# Patient Record
Sex: Female | Born: 1955 | Hispanic: Yes | Marital: Married | Smoking: Never smoker
Health system: Southern US, Community
[De-identification: ages and names within clinical notes are randomized; demographics above are authoritative.]

## PROBLEM LIST (undated history)

## (undated) DIAGNOSIS — K219 Gastro-esophageal reflux disease without esophagitis: Secondary | ICD-10-CM

## (undated) DIAGNOSIS — R51 Headache: Secondary | ICD-10-CM

## (undated) DIAGNOSIS — A159 Respiratory tuberculosis unspecified: Secondary | ICD-10-CM

## (undated) DIAGNOSIS — R519 Headache, unspecified: Secondary | ICD-10-CM

## (undated) DIAGNOSIS — J189 Pneumonia, unspecified organism: Secondary | ICD-10-CM

## (undated) DIAGNOSIS — N189 Chronic kidney disease, unspecified: Secondary | ICD-10-CM

## (undated) DIAGNOSIS — M8580 Other specified disorders of bone density and structure, unspecified site: Secondary | ICD-10-CM

## (undated) DIAGNOSIS — J449 Chronic obstructive pulmonary disease, unspecified: Secondary | ICD-10-CM

## (undated) DIAGNOSIS — K5909 Other constipation: Secondary | ICD-10-CM

## (undated) HISTORY — DX: Other specified disorders of bone density and structure, unspecified site: M85.80

## (undated) HISTORY — PX: TUBAL LIGATION: SHX77

## (undated) HISTORY — PX: ABDOMINAL SURGERY: SHX537

## (undated) HISTORY — DX: Gastro-esophageal reflux disease without esophagitis: K21.9

## (undated) HISTORY — DX: Other constipation: K59.09

---

## 2011-08-24 ENCOUNTER — Ambulatory Visit: Payer: Self-pay

## 2012-01-04 ENCOUNTER — Ambulatory Visit: Payer: Self-pay

## 2012-05-04 LAB — HM DEXA SCAN

## 2012-06-23 ENCOUNTER — Ambulatory Visit: Payer: Self-pay

## 2012-08-28 ENCOUNTER — Ambulatory Visit: Payer: Self-pay | Admitting: Family Medicine

## 2013-05-03 ENCOUNTER — Ambulatory Visit: Payer: Self-pay | Admitting: Family Medicine

## 2013-06-04 ENCOUNTER — Emergency Department: Payer: Self-pay | Admitting: Emergency Medicine

## 2013-06-04 LAB — URINALYSIS, COMPLETE
Bacteria: NONE SEEN
Bilirubin,UR: NEGATIVE
Blood: NEGATIVE
Glucose,UR: NEGATIVE mg/dL (ref 0–75)
Ketone: NEGATIVE
Nitrite: NEGATIVE
RBC,UR: NONE SEEN /HPF (ref 0–5)

## 2013-06-04 LAB — COMPREHENSIVE METABOLIC PANEL
Albumin: 4.3 g/dL (ref 3.4–5.0)
Alkaline Phosphatase: 74 U/L
Anion Gap: 9 (ref 7–16)
Bilirubin,Total: 0.4 mg/dL (ref 0.2–1.0)
Chloride: 102 mmol/L (ref 98–107)
Co2: 27 mmol/L (ref 21–32)
Creatinine: 0.85 mg/dL (ref 0.60–1.30)
EGFR (African American): >60
EGFR (Non-African Amer.): >60
Glucose: 101 mg/dL — ABNORMAL HIGH (ref 65–99)
Potassium: 3.9 mmol/L (ref 3.5–5.1)
SGOT(AST): 30 U/L (ref 15–37)
SGPT (ALT): 33 U/L (ref 12–78)

## 2013-06-04 LAB — CBC WITH DIFFERENTIAL/PLATELET
Basophil #: 0.1 10*3/uL (ref 0.0–0.1)
Basophil %: 1.2 %
HGB: 14.3 g/dL (ref 12.0–16.0)
Lymphocyte #: 2.4 10*3/uL (ref 1.0–3.6)
MCHC: 34.8 g/dL (ref 32.0–36.0)
Monocyte %: 8.6 %
Platelet: 202 10*3/uL (ref 150–440)
RBC: 4.64 10*6/uL (ref 3.80–5.20)
RDW: 12.3 % (ref 11.5–14.5)

## 2013-06-04 LAB — LIPASE, BLOOD: Lipase: 219 U/L (ref 73–393)

## 2013-06-14 ENCOUNTER — Ambulatory Visit: Payer: Self-pay | Admitting: Family Medicine

## 2014-08-27 ENCOUNTER — Ambulatory Visit: Payer: Self-pay

## 2014-09-03 LAB — HM PAP SMEAR: HM Pap smear: NEGATIVE

## 2015-01-22 DIAGNOSIS — I10 Essential (primary) hypertension: Secondary | ICD-10-CM

## 2015-01-22 DIAGNOSIS — K219 Gastro-esophageal reflux disease without esophagitis: Secondary | ICD-10-CM | POA: Insufficient documentation

## 2015-01-22 DIAGNOSIS — K5909 Other constipation: Secondary | ICD-10-CM

## 2015-01-22 DIAGNOSIS — M8580 Other specified disorders of bone density and structure, unspecified site: Secondary | ICD-10-CM

## 2015-01-24 ENCOUNTER — Ambulatory Visit: Payer: Self-pay | Admitting: Unknown Physician Specialty

## 2015-05-14 ENCOUNTER — Encounter: Payer: Self-pay | Admitting: Family Medicine

## 2015-05-14 ENCOUNTER — Ambulatory Visit (INDEPENDENT_AMBULATORY_CARE_PROVIDER_SITE_OTHER): Payer: 59 | Admitting: Family Medicine

## 2015-05-14 ENCOUNTER — Other Ambulatory Visit: Payer: Self-pay | Admitting: Family Medicine

## 2015-05-14 VITALS — BP 128/84 | HR 67 | Temp 97.9°F | Ht 61.1 in | Wt 116.0 lb

## 2015-05-14 DIAGNOSIS — M899 Disorder of bone, unspecified: Secondary | ICD-10-CM

## 2015-05-14 DIAGNOSIS — M8580 Other specified disorders of bone density and structure, unspecified site: Secondary | ICD-10-CM

## 2015-05-14 DIAGNOSIS — Z1239 Encounter for other screening for malignant neoplasm of breast: Secondary | ICD-10-CM

## 2015-05-14 DIAGNOSIS — N390 Urinary tract infection, site not specified: Secondary | ICD-10-CM | POA: Diagnosis not present

## 2015-05-14 DIAGNOSIS — R300 Dysuria: Secondary | ICD-10-CM | POA: Diagnosis not present

## 2015-05-14 DIAGNOSIS — M898X9 Other specified disorders of bone, unspecified site: Secondary | ICD-10-CM

## 2015-05-14 LAB — MICROSCOPIC EXAMINATION

## 2015-05-14 MED ORDER — CIPROFLOXACIN HCL 500 MG PO TABS: 500.0000 mg | ORAL_TABLET | Freq: Two times a day (BID) | ORAL | Status: DC

## 2015-05-14 NOTE — Progress Notes (Signed)
BP 128/84 mmHg  Pulse 67  Temp(Src) 97.9 F (36.6 C)  Ht 5' 1.1" (1.552 m)  Wt 116 lb (52.617 kg)  BMI 21.84 kg/m2  SpO2 100%  LMP  (LMP Unknown)   Subjective:    Patient ID: Wendy Ashley, female    DOB: [birth year], 59 y.o.   MRN: [id number]  HPI: Wendy Ashley is a 59 y.o. female  Chief Complaint  Patient presents with  . Urinary Tract Infection    burning with urination, swelling in abdomen, fatigue and aches.   URINARY SYMPTOMS Duration: yesterday Dysuria: burning Urinary frequency: yes Urgency: yes Small volume voids: yes Symptom severity: moderate Urinary incontinence: no Foul odor: yes Hematuria: yes Abdominal pain: yes Back pain: yes Suprapubic pain/pressure: yes Flank pain: yes Fever:  subjective Vomiting: no Relief with cranberry juice: no Relief with pyridium: no Status: worse Previous urinary tract infection: yes Recurrent urinary tract infection: yes Vaginal discharge: no Treatments attempted: pyridium, cranberry and increasing fluids   Relevant past medical, surgical, family and social history reviewed and updated as indicated. Interim medical history since our last visit reviewed. Allergies and medications reviewed and updated.  Review of Systems  Constitutional: Negative.   Respiratory: Negative.   Cardiovascular: Negative.   Genitourinary: Negative.   Musculoskeletal: Positive for myalgias, back pain and arthralgias. Negative for joint swelling, gait problem, neck pain and neck stiffness.  Psychiatric/Behavioral: Negative.     Per HPI unless specifically indicated above     Objective:    BP 128/84 mmHg  Pulse 67  Temp(Src) 97.9 F (36.6 C)  Ht 5' 1.1" (1.552 m)  Wt 116 lb (52.617 kg)  BMI 21.84 kg/m2  SpO2 100%  LMP  (LMP Unknown)  Wt Readings from Last 3 Encounters:  05/14/15 116 lb (52.617 kg)  09/03/14 115 lb (52.164 kg)    Physical Exam  Constitutional: She is oriented to person, place, and time. She appears  well-developed and well-nourished. No distress.  HENT:  Head: Normocephalic and atraumatic.  Right Ear: Hearing normal.  Left Ear: Hearing normal.  Nose: Nose normal.  Eyes: Conjunctivae and lids are normal. Right eye exhibits no discharge. Left eye exhibits no discharge. No scleral icterus.  Cardiovascular: Normal rate, regular rhythm, normal heart sounds and intact distal pulses.  Exam reveals no gallop and no friction rub.   No murmur heard. Pulmonary/Chest: Effort normal and breath sounds normal. No respiratory distress. She has no wheezes. She has no rales. She exhibits no tenderness.  Abdominal: Soft. Bowel sounds are normal. She exhibits no distension and no mass. There is no hepatosplenomegaly. There is tenderness in the suprapubic area. There is CVA tenderness. There is no rebound and no guarding.  Musculoskeletal: Normal range of motion.  Neurological: She is alert and oriented to person, place, and time.  Skin: Skin is warm, dry and intact. No rash noted. No erythema. No pallor.  Psychiatric: She has a normal mood and affect. Her speech is normal and behavior is normal. Judgment and thought content normal. Cognition and memory are normal.  Nursing note and vitals reviewed.   Results for orders placed or performed in visit on 06/04/13  Urinalysis, Complete  Result Value Ref Range   Color - urine Straw    Clarity - urine Clear    Glucose,UR Negative 0-75 mg/dL   Bilirubin,UR Negative NEGATIVE   Ketone Negative NEGATIVE   Specific Gravity 1.006 1.003-1.030   Blood Negative NEGATIVE   Ph 7.0 4.5-8.0   Protein Negative  NEGATIVE   Nitrite Negative NEGATIVE   Leukocyte Esterase Negative NEGATIVE   RBC,UR NONE SEEN 0-5 /HPF   WBC UR 1 /HPF 0-5 /HPF   Bacteria NONE SEEN NONE SEEN   Squamous Epithelial 1 /HPF   CBC with Differential/Platelet  Result Value Ref Range   WBC 6.2 3.6-11.0 x10 3/mm 3   RBC 4.64 3.80-5.20 X10 6/mm 3   HGB 14.3 12.0-16.0 g/dL   HCT 41.2 35.0-47.0 %    MCV 89 80-100 fL   MCH 30.9 26.0-34.0 pg   MCHC 34.8 32.0-36.0 g/dL   RDW 12.3 11.5-14.5 %   Platelet 202 150-440 x10 3/mm 3   Neutrophil % 48.6 %   Lymphocyte % 39.1 %   Monocyte % 8.6 %   Eosinophil % 2.5 %   Basophil % 1.2 %   Neutrophil # 3.0 1.4-6.5 x10 3/mm 3   Lymphocyte # 2.4 1.0-3.6 x10 3/mm 3   Monocyte # 0.5 0.2-0.9 x10 3/mm    Eosinophil # 0.2 0.0-0.7 x10 3/mm 3   Basophil # 0.1 0.0-0.1 x10 3/mm 3  Comprehensive metabolic panel  Result Value Ref Range   Glucose 101 (H) 65-99 mg/dL   BUN 18 7-18 mg/dL   Creatinine 0.85 0.60-1.30 mg/dL   Sodium 138 136-145 mmol/L   Potassium 3.9 3.5-5.1 mmol/L   Chloride 102 98-107 mmol/L   Co2 27 21-32 mmol/L   Calcium, Total 9.4 8.5-10.1 mg/dL   SGOT(AST) 30 15-37 Unit/L   SGPT (ALT) 33 12-78 U/L   Alkaline Phosphatase 74 Unit/L   Albumin 4.3 3.4-5.0 g/dL   Total Protein 7.8 6.4-8.2 g/dL   Bilirubin,Total 0.4 0.2-1.0 mg/dL   Osmolality 278 275-301   Anion Gap 9 7-16   EGFR (African American) >60    EGFR (Non-African Amer.) >60   Lipase, blood  Result Value Ref Range   Lipase 219 73-393 Unit/L      Assessment & Plan:   Problem List Items Addressed This Visit      Musculoskeletal and Integument   Osteopenia    Due for DEXA after 05/18/15- ordered today.      Relevant Orders   DG Bone Density     Genitourinary   Recurrent UTI - Primary    Has another UTI today. Will treat with cipro x 7 days due to CVA tenderness. Would like to see a specialist because she says she gets them all the time. Referral generated today.      Relevant Orders   Ambulatory referral to Urology    Other Visit Diagnoses    Dysuria        Checking UA.     Screening for breast cancer        Never got her mammogram. Ordered today. She will get it with her bone density    Relevant Orders    MM DIGITAL SCREENING BILATERAL    Bone pain        Possibly due to illness. Wants repeat DEXA- ordered today. Follow up with PCP in 2-3 weeks if not  resolved.         Follow up plan: Return 2-3 weeks with Malachy Mood, for follow up bone pain.

## 2015-05-14 NOTE — Assessment & Plan Note (Signed)
Has another UTI today. Will treat with cipro x 7 days due to CVA tenderness. Would like to see a specialist because she says she gets them all the time. Referral generated today.

## 2015-05-14 NOTE — Assessment & Plan Note (Signed)
Due for DEXA after 05/18/15- ordered today.

## 2015-05-19 LAB — UA/M W/RFLX CULTURE, ROUTINE

## 2015-05-19 LAB — URINE CULTURE, REFLEX

## 2015-05-22 ENCOUNTER — Encounter: Payer: Self-pay | Admitting: Obstetrics and Gynecology

## 2015-05-22 ENCOUNTER — Ambulatory Visit (INDEPENDENT_AMBULATORY_CARE_PROVIDER_SITE_OTHER): Payer: 59 | Admitting: Obstetrics and Gynecology

## 2015-05-22 VITALS — BP 126/84 | HR 74 | Resp 16 | Ht 62.0 in | Wt 117.7 lb

## 2015-05-22 DIAGNOSIS — N390 Urinary tract infection, site not specified: Secondary | ICD-10-CM | POA: Diagnosis not present

## 2015-05-22 LAB — BLADDER SCAN AMB NON-IMAGING: Scan Result: 23

## 2015-05-22 NOTE — Patient Instructions (Signed)

## 2015-05-22 NOTE — Progress Notes (Signed)
05/22/2015 1:05 PM   Wendy Ashley 15-Nov-1955 [id number]  Referring provider: Kathrine Haddock, NP [place], [place] [address]  Chief Complaint  Patient presents with  . Recurrent UTI  . Establish Care    HPI: Patient is a 59 year old female presenting today as a referral from her primary care provider complaints of recurrent urinary tract infections for the last year. She reports recurrent symptoms of dysuria, frequency, few episodes of gross hematuria, nocturia up to 2 times per night and suprapubic discomfort.  Patient reports UTI occuring once per month for the last year.  She states that she is unsure if previous specimens were sent for culture.  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  Urinary symptoms include urinary frequency, nocturia 2 times per night, intermittent dysuria.  Patient reports that she did notice  Patient states that she finished Cipro yesterday.  H2O intake 4-5 bottle of water daily. No history of renal stones.  No vaginal complaints.   PMH: Past Medical History  Diagnosis Date  . Chronic constipation   . Hypertension   . GERD (gastroesophageal reflux disease)   . Osteopenia     Surgical History: Past Surgical History  Procedure Laterality Date  . Tubal ligation      Home Medications:    Medication List       This list is accurate as of: 05/22/15 11:59 PM.  Always use your most recent med list.               ciprofloxacin 500 MG tablet  Commonly known as:  CIPRO  Take 1 tablet (500 mg total) by mouth 2 (two) times daily.     omeprazole 20 MG capsule  Commonly known as:  PRILOSEC  Take 20 mg by mouth daily.        Allergies:  Allergies  Allergen Reactions  . Tylenol [Acetaminophen]     Family History: Family History  Problem Relation Age of Onset  . Hypertension Mother   . Alzheimer's disease Father     Social History:  reports that she has  never smoked. She does not have any smokeless tobacco history on file. She reports that she does not drink alcohol or use illicit drugs.  ROS: UROLOGY Frequent Urination?: Yes Hard to postpone urination?: No Burning/pain with urination?: Yes Get up at night to urinate?: Yes Leakage of urine?: No Urine stream starts and stops?: No Trouble starting stream?: No Do you have to strain to urinate?: No Blood in urine?: Yes Urinary tract infection?: No Sexually transmitted disease?: No Injury to kidneys or bladder?: No Painful intercourse?: Yes Weak stream?: No Currently pregnant?: No Vaginal bleeding?: No Last menstrual period?: 1996  Gastrointestinal Nausea?: Yes Vomiting?: No Indigestion/heartburn?: Yes Diarrhea?: No Constipation?: Yes  Constitutional Fever: Yes Night sweats?: No Weight loss?: No Fatigue?: Yes  Skin Skin rash/lesions?: No Itching?: No  Eyes Blurred vision?: No Double vision?: No  Ears/Nose/Throat Sore throat?: No Sinus problems?: No  Hematologic/Lymphatic Swollen glands?: No Easy bruising?: No  Cardiovascular Leg swelling?: No Chest pain?: No  Respiratory Cough?: No Shortness of breath?: No  Endocrine Excessive thirst?: No  Musculoskeletal Back pain?: Yes Joint pain?: No  Neurological Headaches?: No Dizziness?: Yes  Psychologic Depression?: No Anxiety?: No  Physical Exam: BP 126/84 mmHg  Pulse 74  Resp 16  Ht 5\' 2"  (1.575 m)  Wt 117 lb 11.2 oz (53.388 kg)  BMI 21.52 kg/m2  LMP  (LMP Unknown)  Constitutional:  Alert and oriented, No acute distress. HEENT: [place] AT, moist mucus membranes.  Trachea midline, no masses. Cardiovascular: No clubbing, cyanosis, or edema. Respiratory: Normal respiratory effort, no increased work of breathing. GI: Abdomen is soft, mild suprapubic tenderness, nondistended, no abdominal masses GU: No CVA tenderness. Pelvic: mild vaginal atrophy, normal cervix and uterus, no lesions, good vaginal  vault support Skin: No rashes, bruises or suspicious lesions. Lymph: No cervical or inguinal adenopathy. Neurologic: Grossly intact, no focal deficits, moving all 4 extremities. Psychiatric: Normal mood and affect.  Laboratory Data:   Urinalysis Results for orders placed or performed in visit on 05/22/15  Microscopic Examination  Result Value Ref Range   WBC, UA None seen 0 -  5 /hpf   RBC, UA None seen 0 -  2 /hpf   Epithelial Cells (non renal) None seen 0 - 10 /hpf   Bacteria, UA None seen None seen/Few  Urinalysis, Complete  Result Value Ref Range   Specific Gravity, UA <1.005 (L) 1.005 - 1.030   pH, UA 5.5 5.0 - 7.5   Color, UA Yellow Yellow   Appearance Ur Clear Clear   Leukocytes, UA Negative Negative   Protein, UA Negative Negative/Trace   Glucose, UA Negative Negative   Ketones, UA Negative Negative   RBC, UA Trace (A) Negative   Bilirubin, UA Negative Negative   Urobilinogen, Ur 0.2 0.2 - 1.0 mg/dL   Nitrite, UA Negative Negative   Microscopic Examination See below:   BLADDER SCAN AMB NON-IMAGING  Result Value Ref Range   Scan Result 23 mL     Pertinent Imaging:   Assessment & Plan:    1. Gross hematuria- Intermittent episodes of gross hematuria associated with irritative voiding symptoms. We discussed the differential diagnosis for hematuria including nephrolithiasis, renal or upper tract tumors, bladder stones, UTIs, or bladder tumors as well as undetermined etiologies. Per AUA guidelines, I did recommend complete hematuria evaluation including CTU, possible urine cytology, and office cystoscopy.  2. Recurrent UTI- UTI prevention strategies discussed.  Good perineal hygiene reviewed. Patient is encouraged to increase daily water intake, start cranberry supplements to prevent invasive colonization along the urinary tract and probiotics, especially lactobacillus to restore normal vaginal flora. - Urinalysis, Complete - BLADDER SCAN AMB NON-IMAGING  Return for  CT results; cystoscopy.  These notes generated with voice recognition software. I apologize for typographical errors.  Herbert Moors, [facility] [address], [place], [place] [address] [phone]

## 2015-05-23 LAB — URINALYSIS, COMPLETE
Bilirubin, UA: NEGATIVE
GLUCOSE, UA: NEGATIVE
KETONES UA: NEGATIVE
Leukocytes, UA: NEGATIVE
NITRITE UA: NEGATIVE
Protein, UA: NEGATIVE
Urobilinogen, Ur: 0.2 mg/dL (ref 0.2–1.0)
pH, UA: 5.5 (ref 5.0–7.5)

## 2015-05-23 LAB — MICROSCOPIC EXAMINATION
Bacteria, UA: NONE SEEN
Epithelial Cells (non renal): NONE SEEN /hpf (ref 0–10)
RBC MICROSCOPIC, UA: NONE SEEN /HPF (ref 0–?)
WBC, UA: NONE SEEN /hpf (ref 0–?)

## 2015-05-27 ENCOUNTER — Ambulatory Visit: Payer: 59 | Admitting: Family Medicine

## 2015-05-30 ENCOUNTER — Ambulatory Visit (INDEPENDENT_AMBULATORY_CARE_PROVIDER_SITE_OTHER): Payer: 59 | Admitting: Family Medicine

## 2015-05-30 ENCOUNTER — Encounter: Payer: Self-pay | Admitting: Family Medicine

## 2015-05-30 VITALS — BP 121/80 | HR 72 | Temp 98.6°F | Wt 116.0 lb

## 2015-05-30 DIAGNOSIS — K121 Other forms of stomatitis: Secondary | ICD-10-CM

## 2015-05-30 DIAGNOSIS — K1230 Oral mucositis (ulcerative), unspecified: Secondary | ICD-10-CM | POA: Diagnosis not present

## 2015-05-30 MED ORDER — MAGIC MOUTHWASH W/LIDOCAINE: 5.0000 mL | Freq: Three times a day (TID) | ORAL | Status: DC | PRN

## 2015-05-30 MED ORDER — VALACYCLOVIR HCL 1 G PO TABS: 2000.0000 mg | ORAL_TABLET | Freq: Two times a day (BID) | ORAL | Status: DC

## 2015-05-30 NOTE — Progress Notes (Signed)
BP 121/80 mmHg  Pulse 72  Temp(Src) 98.6 F (37 C)  Wt 116 lb (52.617 kg)  SpO2 98%  LMP  (LMP Unknown)   Subjective:    Patient ID: Wendy Ashley, female    DOB: July 30, 1955, 59 y.o.   MRN: [id number]  HPI: Wendy Ashley is a 59 y.o. female  Chief Complaint  Patient presents with  . Oral Swelling    Patient states that she has had swelling, pain,burning in her lips for the last two weeks.   Swollen Lips- when she wakes up her lips are swollen. White spots and burning, ate shrimp 2 weeks ago and thought that it was the cause, but it has lasted too long Duration: 2 weeks (17 days) Location: lips a little bit into the face  Itching: yes Burning: yes Redness: yes  Swelling: yes Oozing: no Scaling: no Blisters: no Painful: yes Fevers: subjective Change in detergents/soaps/personal care products: no Recent illness: no Recent travel:no History of same: no Context: stable Alleviating factors: lotion/moisturizer Treatments attempted:lotion/moisturizer Shortness of breath: no  Throat/tongue swelling: no Myalgias/arthralgias: no  Relevant past medical, surgical, family and social history reviewed and updated as indicated. Interim medical history since our last visit reviewed. Allergies and medications reviewed and updated.  Review of Systems  Constitutional: Negative.   HENT: Negative.   Respiratory: Negative.   Cardiovascular: Negative.   Psychiatric/Behavioral: Negative.    Per HPI unless specifically indicated above     Objective:    BP 121/80 mmHg  Pulse 72  Temp(Src) 98.6 F (37 C)  Wt 116 lb (52.617 kg)  SpO2 98%  LMP  (LMP Unknown)  Wt Readings from Last 3 Encounters:  05/30/15 116 lb (52.617 kg)  05/22/15 117 lb 11.2 oz (53.388 kg)  05/14/15 116 lb (52.617 kg)    Physical Exam  Constitutional: She is oriented to person, place, and time. She appears well-developed and well-nourished. No distress.  HENT:  Head: Normocephalic and atraumatic.  Right  Ear: Hearing normal.  Left Ear: Hearing normal.  Nose: Nose normal.  Mouth/Throat: Uvula is midline and mucous membranes are normal. Posterior oropharyngeal edema and posterior oropharyngeal erythema present. No oropharyngeal exudate or tonsillar abscesses.    vesicles along lateral borders of lips, lips swollen, tongue normal, slight erythema posterior-pharynx, no vesicles  Eyes: Conjunctivae and lids are normal. Right eye exhibits no discharge. Left eye exhibits no discharge. No scleral icterus.  Pulmonary/Chest: Effort normal. No respiratory distress.  Musculoskeletal: Normal range of motion.  Neurological: She is alert and oriented to person, place, and time.  Skin: Skin is intact. No rash noted.  Psychiatric: She has a normal mood and affect. Her speech is normal and behavior is normal. Judgment and thought content normal. Cognition and memory are normal.  Nursing note and vitals reviewed.   Results for orders placed or performed in visit on 05/22/15  Microscopic Examination  Result Value Ref Range   WBC, UA None seen 0 -  5 /hpf   RBC, UA None seen 0 -  2 /hpf   Epithelial Cells (non renal) None seen 0 - 10 /hpf   Bacteria, UA None seen None seen/Few  Urinalysis, Complete  Result Value Ref Range   Specific Gravity, UA <1.005 (L) 1.005 - 1.030   pH, UA 5.5 5.0 - 7.5   Color, UA Yellow Yellow   Appearance Ur Clear Clear   Leukocytes, UA Negative Negative   Protein, UA Negative Negative/Trace   Glucose, UA Negative Negative  Ketones, UA Negative Negative   RBC, UA Trace (A) Negative   Bilirubin, UA Negative Negative   Urobilinogen, Ur 0.2 0.2 - 1.0 mg/dL   Nitrite, UA Negative Negative   Microscopic Examination See below:   BLADDER SCAN AMB NON-IMAGING  Result Value Ref Range   Scan Result 23 mL       Assessment & Plan:   Problem List Items Addressed This Visit    None    Visit Diagnoses    Stomatitis and mucositis    -  Primary    Appears to be stomatitis. Will  treat with valtrex and magic mouth wash and check in on how she's doing on Monday. If not better, will do course of prednisone.         Follow up plan: Return if symptoms worsen or fail to improve.

## 2015-06-02 ENCOUNTER — Telehealth: Payer: Self-pay | Admitting: Family Medicine

## 2015-06-02 MED ORDER — PREDNISONE 10 MG PO TABS: ORAL_TABLET | ORAL | Status: DC

## 2015-06-02 NOTE — Telephone Encounter (Signed)
Rx for prednisone sent to her pharmacy. If not feeling better in the next day or so, let us know

## 2015-06-02 NOTE — Telephone Encounter (Signed)
Forward to provider

## 2015-06-02 NOTE — Telephone Encounter (Signed)
Pt called stated the swelling in her lips is not going down, wants to know if something different can be called in she does not feel like the current medication is working. Pharm is [facility] [address] Thanks.

## 2015-06-02 NOTE — Telephone Encounter (Signed)
Patient notified

## 2015-06-03 ENCOUNTER — Ambulatory Visit: Payer: 59 | Admitting: Unknown Physician Specialty

## 2015-06-10 ENCOUNTER — Ambulatory Visit: Payer: 59 | Admitting: Obstetrics and Gynecology

## 2015-06-11 ENCOUNTER — Other Ambulatory Visit: Payer: Self-pay | Admitting: Obstetrics and Gynecology

## 2015-06-11 ENCOUNTER — Telehealth: Payer: Self-pay | Admitting: Obstetrics and Gynecology

## 2015-06-11 DIAGNOSIS — R310 Gross hematuria: Secondary | ICD-10-CM

## 2015-06-11 NOTE — Telephone Encounter (Signed)
Patient called today asking why she never got a call to schd her CT scan? I looked back at your notes and it looks like she was to be schd for a CT and a cysto but the orders were never put in. So does she still need to have them done? She cancelled her last appt with [place] via the automated reminder.   Thanks,  Sharyn Lull

## 2015-06-11 NOTE — Telephone Encounter (Signed)
Yes she does need a CT Urogram.  I'm not sure why the order wasn't in.  I just put one in.  Thank you for checking on that. She needs an appt to follow up for results and had a cysto.  Thanks for checking on this.

## 2015-06-17 ENCOUNTER — Ambulatory Visit
Admission: RE | Admit: 2015-06-17 | Discharge: 2015-06-17 | Disposition: A | Discharge status: Home / Self Care | Payer: 59 | Source: Ambulatory Visit | Attending: Family Medicine | Admitting: Family Medicine

## 2015-06-17 DIAGNOSIS — Z1231 Encounter for screening mammogram for malignant neoplasm of breast: Secondary | ICD-10-CM | POA: Diagnosis present

## 2015-06-17 DIAGNOSIS — Z13820 Encounter for screening for osteoporosis: Secondary | ICD-10-CM | POA: Insufficient documentation

## 2015-06-17 DIAGNOSIS — Z1239 Encounter for other screening for malignant neoplasm of breast: Secondary | ICD-10-CM

## 2015-06-17 DIAGNOSIS — M8580 Other specified disorders of bone density and structure, unspecified site: Secondary | ICD-10-CM | POA: Diagnosis not present

## 2015-06-18 ENCOUNTER — Encounter: Payer: Self-pay | Admitting: Family Medicine

## 2015-06-24 ENCOUNTER — Ambulatory Visit: Payer: Self-pay

## 2015-06-24 ENCOUNTER — Encounter: Payer: Self-pay | Admitting: Urology

## 2015-06-24 ENCOUNTER — Other Ambulatory Visit: Payer: 59 | Admitting: Urology

## 2015-06-30 ENCOUNTER — Ambulatory Visit
Admission: RE | Admit: 2015-06-30 | Discharge: 2015-06-30 | Disposition: A | Discharge status: Home / Self Care | Payer: 59 | Source: Ambulatory Visit | Attending: Obstetrics and Gynecology | Admitting: Obstetrics and Gynecology

## 2015-06-30 DIAGNOSIS — R935 Abnormal findings on diagnostic imaging of other abdominal regions, including retroperitoneum: Secondary | ICD-10-CM | POA: Insufficient documentation

## 2015-06-30 DIAGNOSIS — R911 Solitary pulmonary nodule: Secondary | ICD-10-CM | POA: Diagnosis not present

## 2015-06-30 DIAGNOSIS — R310 Gross hematuria: Secondary | ICD-10-CM | POA: Insufficient documentation

## 2015-06-30 MED ORDER — IOHEXOL 350 MG/ML SOLN
125.0000 mL | Freq: Once | INTRAVENOUS | Status: AC | PRN
  Administered 2015-06-30: 125 mL via INTRAVENOUS

## 2015-07-17 ENCOUNTER — Telehealth: Payer: Self-pay | Admitting: Obstetrics and Gynecology

## 2015-07-17 NOTE — Telephone Encounter (Signed)
This patient artery had her CT urogram. She should've been scheduled for cystoscopy and CT results. I did not see any appointment scheduled. We please check on this. Thanks

## 2015-07-22 NOTE — Telephone Encounter (Signed)
Left another message for the patient to call the office to schedule CT results and cysto appointment.

## 2015-07-24 NOTE — Telephone Encounter (Signed)
Patient called back and i scheduled her appointments  Wendy Ashley

## 2015-07-31 ENCOUNTER — Encounter: Payer: Self-pay | Admitting: Urology

## 2015-07-31 ENCOUNTER — Ambulatory Visit (INDEPENDENT_AMBULATORY_CARE_PROVIDER_SITE_OTHER): Payer: 59 | Admitting: Urology

## 2015-07-31 VITALS — BP 130/91 | HR 71 | Ht 62.0 in | Wt 116.6 lb

## 2015-07-31 DIAGNOSIS — R310 Gross hematuria: Secondary | ICD-10-CM

## 2015-07-31 DIAGNOSIS — N390 Urinary tract infection, site not specified: Secondary | ICD-10-CM

## 2015-07-31 LAB — URINALYSIS, COMPLETE
Bilirubin, UA: NEGATIVE
Glucose, UA: NEGATIVE
KETONES UA: NEGATIVE
NITRITE UA: NEGATIVE
PH UA: 7.5 (ref 5.0–7.5)
Protein, UA: NEGATIVE
Specific Gravity, UA: 1.01 (ref 1.005–1.030)
Urobilinogen, Ur: 0.2 mg/dL (ref 0.2–1.0)

## 2015-07-31 LAB — MICROSCOPIC EXAMINATION: RBC MICROSCOPIC, UA: NONE SEEN /HPF (ref 0–?)

## 2015-07-31 MED ORDER — CIPROFLOXACIN HCL 500 MG PO TABS
500.0000 mg | ORAL_TABLET | Freq: Once | ORAL | Status: AC
  Administered 2015-07-31: 500 mg via ORAL

## 2015-07-31 MED ORDER — LIDOCAINE HCL 2 % EX GEL
1.0000 "application " | Freq: Once | CUTANEOUS | Status: AC
  Administered 2015-07-31: 1 via URETHRAL

## 2015-07-31 NOTE — Progress Notes (Signed)
07/31/2015 12:19 PM   Wendy Ashley 03/20/1956 [id number]  Referring provider: Kathrine Haddock, NP [place], [place] [address]  Chief Complaint  Patient presents with  . Cysto    gross hematuria, recurrent UTI    HPI: Patient is a 60 year old female presenting today as a referral from her primary care provider complaints of recurrent urinary tract infections for the last year. She reports recurrent symptoms of dysuria, frequency, few episodes of gross hematuria, nocturia up to 2 times per night and suprapubic discomfort.  Patient reports UTI occuring once per month for the last year. She states that she is unsure if previous specimens were sent for culture. 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  Urinary symptoms include urinary frequency, nocturia 2 times per night, intermittent dysuria. Patient reports that she did notice  Patient states that she finished Cipro yesterday.  H2O intake 4-5 bottle of water daily. No history of renal stones.  No vaginal complaints.  Interval history: The patient follows up for completion of her hematuria workup. Her CT was negative for any GU pathology. She did have lung nodules noted though.  PMH: Past Medical History  Diagnosis Date  . Chronic constipation   . Hypertension   . GERD (gastroesophageal reflux disease)   . Osteopenia     Surgical History: Past Surgical History  Procedure Laterality Date  . Tubal ligation      Home Medications:    Medication List       This list is accurate as of: 07/31/15 12:19 PM.  Always use your most recent med list.               omeprazole 20 MG capsule  Commonly known as:  PRILOSEC  Take 20 mg by mouth daily.     predniSONE 10 MG tablet  Commonly known as:  DELTASONE  6 tabs today, 5 tabs tomorrow, decrease by 1 each day until gone     valACYclovir 1000 MG tablet  Commonly known as:  VALTREX  Take  2 tablets (2,000 mg total) by mouth 2 (two) times daily. For 1 day, then 1/2 tab daily after that        Allergies:  Allergies  Allergen Reactions  . Tylenol [Acetaminophen]     Family History: Family History  Problem Relation Age of Onset  . Hypertension Mother   . Alzheimer's disease Father     Social History:  reports that she has never smoked. She does not have any smokeless tobacco history on file. She reports that she does not drink alcohol or use illicit drugs.  ROS:                                        Physical Exam: BP 130/91 mmHg  Pulse 71  Ht 5\' 2"  (1.575 m)  Wt 116 lb 9.6 oz (52.889 kg)  BMI 21.32 kg/m2  LMP  (LMP Unknown)  Constitutional:  Alert and oriented, No acute distress. HEENT: [place] AT, moist mucus membranes.  Trachea midline, no masses. Cardiovascular: No clubbing, cyanosis, or edema. Respiratory: Normal respiratory effort, no increased work of breathing. GI: Abdomen is soft, nontender, nondistended, no abdominal masses GU: No CVA tenderness.  Skin: No rashes, bruises or suspicious lesions. Lymph: No cervical or inguinal adenopathy. Neurologic: Grossly intact, no focal deficits, moving  all 4 extremities. Psychiatric: Normal mood and affect.  Laboratory Data: Lab Results  Component Value Date   WBC 6.2 06/04/2013   HGB 14.3 06/04/2013   HCT 41.2 06/04/2013   MCV 89 06/04/2013   PLT 202 06/04/2013    Lab Results  Component Value Date   CREATININE 0.85 06/04/2013    No results found for: PSA  No results found for: TESTOSTERONE  No results found for: HGBA1C  Urinalysis    Component Value Date/Time   COLORURINE Straw 06/04/2013 1654   APPEARANCEUR Clear 06/04/2013 1654   LABSPEC 1.006 06/04/2013 1654   PHURINE 7.0 06/04/2013 1654   GLUCOSEU Negative 05/22/2015 1452   GLUCOSEU Negative 06/04/2013 1654   HGBUR Negative 06/04/2013 1654   BILIRUBINUR Negative 05/22/2015 1452   BILIRUBINUR Negative 06/04/2013  1654   KETONESUR Negative 06/04/2013 1654   PROTEINUR Negative 06/04/2013 1654   NITRITE Negative 05/22/2015 1452   NITRITE Negative 06/04/2013 1654   LEUKOCYTESUR Negative 05/22/2015 1452   LEUKOCYTESUR Negative 06/04/2013 1654    Pertinent Imaging: Study Result     CLINICAL DATA: Gross hematuria for 1 month. Chronic urinary tract infections for 6 months.  EXAM: CT ABDOMEN AND PELVIS WITHOUT AND WITH CONTRAST  TECHNIQUE: Multidetector CT imaging of the abdomen and pelvis was performed following the standard protocol before and following the bolus administration of intravenous contrast.  CONTRAST: 173mL OMNIPAQUE IOHEXOL 350 MG/ML SOLN  COMPARISON: CT abdomen 06/04/2013  FINDINGS: Lower chest: Subpleural nodule in the mid LEFT lower lobe measures 4 mm on image 5, series 2. No comparison available.  Hepatobiliary: No focal hepatic lesion. No biliary duct dilatation. Gallbladder is normal. Common bile duct is normal.  Pancreas: Pancreas is normal. No ductal dilatation. No pancreatic inflammation.  Spleen: Normal spleen  Adrenals/urinary tract: Adrenal glands normal.  Non IV contrast images demonstrate no nephrolithiasis or ureterolithiasis. Cortical phase imaging demonstrates no enhancing renal cortical. Delayed pyelogram phase imaging demonstrates no filling defects complex cyst ureters.  No bladder calculi. No filling defect the bladder.  Small amount of gas the bladder presumably related to instrumentation.  Stomach/Bowel: Stomach, small-bowel, appendix, and cecum normal. The colon and rectosigmoid colon are normal.  Vascular/Lymphatic: Abdominal aorta is normal caliber. There is no retroperitoneal or periportal lymphadenopathy. No pelvic lymphadenopathy.  Reproductive: Small enhancing focus along the endometrium measuring 4 mm is unchanged from prior likely represents a submucosal leiomyoma. Ovaries are normal.  Other: No free  fluid.  Musculoskeletal: No aggressive osseous lesion.  IMPRESSION: 1. No explanation for hematuria. No nephrolithiasis, ureterolithiasis, enhancing renal cortical lesion, or filling defects within the collecting systems. 2. Small amount a gas the bladder presumably related to a instrumentation. 3. 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 4. LEFT lower lobe pulmonary nodule. If the patient is at high risk for bronchogenic carcinoma, follow-up chest CT at 1 year is recommended. If the patient is at low risk, no follow-up is needed. This recommendation follows the consensus statement: Guidelines for Management of Small Pulmonary Nodules Detected on CT Scans: A Statement from the [place] as published in Radiology 2005; 237:395-400.      Cystoscopy Procedure Note  Patient identification was confirmed, informed consent was obtained, and patient was prepped using Betadine solution.  Lidocaine jelly was administered per urethral meatus.    Preoperative abx where received prior to procedure.    Procedure: - Flexible cystoscope introduced, without any difficulty.   - Thorough search of the bladder revealed:    normal urethral meatus  normal urothelium   Hypervascular, erythematous lesions along the posterior bladder wall  - Ureteral orifices were normal in position and appearance.  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 I discussed cystoscopy and bladder biopsy with the patient in great detail. All risks, benefits and indications of procedure prescribed great detail. The patient consented to the procedure.  1. Bladder lesion -cystoscopy, bladder biopsy  2. Lung nodule The patient was given a copy of her CT scan report instructed to discuss it with her primary care doctor. She is aware that she may need a repeat CT  scan in one year. She will discuss this with her primary care doctor.  No Follow-up on file.  Nickie Retort, MD  [facility] [address] [place], [place] [address] [phone]

## 2015-08-01 ENCOUNTER — Telehealth: Payer: Self-pay | Admitting: Radiology

## 2015-08-01 NOTE — Telephone Encounter (Signed)
LMOM. Need to notify pt of surgery information. 

## 2015-08-01 NOTE — Telephone Encounter (Signed)
Notified pt of surgery scheduled 08/27/15, pre-admit testing phone interview on 08/20/15 between 9am-1pm and to call day prior to surgery for arrival time to SDS. Pt voices understanding.

## 2015-08-19 ENCOUNTER — Encounter: Payer: Self-pay | Admitting: Unknown Physician Specialty

## 2015-08-19 ENCOUNTER — Ambulatory Visit (INDEPENDENT_AMBULATORY_CARE_PROVIDER_SITE_OTHER): Payer: 59 | Admitting: Unknown Physician Specialty

## 2015-08-19 VITALS — BP 131/83 | HR 69 | Temp 98.2°F | Ht 61.1 in | Wt 119.4 lb

## 2015-08-19 DIAGNOSIS — J449 Chronic obstructive pulmonary disease, unspecified: Secondary | ICD-10-CM | POA: Diagnosis not present

## 2015-08-19 DIAGNOSIS — IMO0001 Reserved for inherently not codable concepts without codable children: Secondary | ICD-10-CM | POA: Insufficient documentation

## 2015-08-19 DIAGNOSIS — R911 Solitary pulmonary nodule: Secondary | ICD-10-CM | POA: Diagnosis not present

## 2015-08-19 DIAGNOSIS — J45909 Unspecified asthma, uncomplicated: Secondary | ICD-10-CM | POA: Insufficient documentation

## 2015-08-19 MED ORDER — ALBUTEROL SULFATE HFA 108 (90 BASE) MCG/ACT IN AERS: 2.0000 | INHALATION_SPRAY | Freq: Four times a day (QID) | RESPIRATORY_TRACT | Status: DC | PRN

## 2015-08-19 NOTE — Progress Notes (Signed)
-------+-------------  BP 131/83 mmHg  Pulse 69  Temp(Src) 98.2 F (36.8 C)  Ht 5' 1.1" (1.552 m)  Wt 119 lb 6.4 oz (54.159 kg)  BMI 22.48 kg/m2  SpO2 100%  LMP  (LMP Unknown)   Subjective:    Patient ID: Wendy Ashley, female    DOB: May 16, 1956, 60 y.o.   MRN: [id number]  HPI: Wendy Ashley is a 60 y.o. female  Chief Complaint  Patient presents with  . Results    pt states she would like to talk about her CT scan that was done by the urologist   Lung nodule - Pt with a 4 mm lung nodule on CT scan left lower lobe.  Recommendationis to f/u this in 1 year only if she is at high risk for carcinoma.  Not f/u necessary if at low risk.  Pt has the occasional cough and tightness of chest when she coughs.  She had pneumonia twice when she was young and is concerned.  I reviewed her chest x-ray of about a year ago and previous granulomatous infection was noted.  It did not show changes consistent with COPD confirmed by Spirometry at that time.  She does say she was exposed to a lot of wood smoke at some point.    SOB/cough She saw Dr. Wynetta Emery about 6 months ago for tightness in chest and coughing.  She was given an inhaler but never took it due to the cost.    Relevant past medical, surgical, family and social history reviewed and updated as indicated. Interim medical history since our last visit reviewed. Allergies and medications reviewed and updated.  Review of Systems  Per HPI unless specifically indicated above     Objective:    BP 131/83 mmHg  Pulse 69  Temp(Src) 98.2 F (36.8 C)  Ht 5' 1.1" (1.552 m)  Wt 119 lb 6.4 oz (54.159 kg)  BMI 22.48 kg/m2  SpO2 100%  LMP  (LMP Unknown)  Wt Readings from Last 3 Encounters:  08/19/15 119 lb 6.4 oz (54.159 kg)  07/31/15 116 lb 9.6 oz (52.889 kg)  05/30/15 116 lb (52.617 kg)    Physical Exam  Constitutional: She is oriented to person, place, and time. She appears well-developed and well-nourished. No distress.  HENT:  Head:  Normocephalic and atraumatic.  Eyes: Conjunctivae and lids are normal. Right eye exhibits no discharge. Left eye exhibits no discharge. No scleral icterus.  Cardiovascular: Normal rate.   Pulmonary/Chest: Effort normal.  Abdominal: Normal appearance. There is no splenomegaly or hepatomegaly.  Musculoskeletal: Normal range of motion.  Neurological: She is alert and oriented to person, place, and time.  Skin: Skin is intact. No rash noted. No pallor.  Psychiatric: She has a normal mood and affect. Her behavior is normal. Judgment and thought content normal.    Results for orders placed or performed in visit on 07/31/15  Microscopic Examination  Result Value Ref Range   WBC, UA 0-5 0 -  5 /hpf   RBC, UA None seen 0 -  2 /hpf   Epithelial Cells (non renal) 0-10 0 - 10 /hpf   Bacteria, UA Few (A) None seen/Few  Urinalysis, Complete  Result Value Ref Range   Specific Gravity, UA 1.010 1.005 - 1.030   pH, UA 7.5 5.0 - 7.5   Color, UA Yellow Yellow   Appearance Ur Clear Clear   Leukocytes, UA 2+ (A) Negative   Protein, UA Negative Negative/Trace   Glucose, UA Negative Negative  Ketones, UA Negative Negative   RBC, UA Trace (A) Negative   Bilirubin, UA Negative Negative   Urobilinogen, Ur 0.2 0.2 - 1.0 mg/dL   Nitrite, UA Negative Negative   Microscopic Examination See below:       Assessment & Plan:   Problem List Items Addressed This Visit      Unprioritized   Lung nodule < 6cm on CT - Primary    Recheck in 1 year due to COPD and granulomatous changes on chest x-ray      Relevant Orders   CT CHEST NODULE FOLLOW UP LOW DOSE W/O   COPD, mild (HCC)    Rx for Albuterol to take prn      Relevant Medications   albuterol (PROVENTIL HFA;VENTOLIN HFA) 108 (90 Base) MCG/ACT inhaler       Follow up plan: Return for April for physical.

## 2015-08-19 NOTE — Assessment & Plan Note (Signed)
Recheck in 1 year due to COPD and granulomatous changes on chest x-ray

## 2015-08-19 NOTE — Assessment & Plan Note (Signed)
Rx for Albuterol to take prn

## 2015-08-20 ENCOUNTER — Other Ambulatory Visit: Payer: 59

## 2015-08-21 ENCOUNTER — Encounter: Payer: Self-pay | Admitting: *Deleted

## 2015-08-21 ENCOUNTER — Other Ambulatory Visit: Payer: 59

## 2015-08-21 NOTE — Patient Instructions (Signed)
  Your procedure is scheduled on: 08-27-15 [facility]) Report to [place] To find out your arrival time please call [phone] between 1PM - 3PM on 08-26-15 (TUESDAY)  Remember: Instructions that are not followed completely may result in serious medical risk, up to and including death, or upon the discretion of your surgeon and anesthesiologist your surgery may need to be rescheduled.    _X___ 1. Do not eat food or drink liquids after midnight. No gum chewing or hard candies.     _X___ 2. No Alcohol for 24 hours before or after surgery.   ____ 3. Bring all medications with you on the day of surgery if instructed.    _X___ 4. Notify your doctor if there is any change in your medical condition     (cold, fever, infections).     Do not wear jewelry, make-up, hairpins, clips or nail polish.  Do not wear lotions, powders, or perfumes. You may wear deodorant.  Do not shave 48 hours prior to surgery. Men may shave face and neck.  Do not bring valuables to the hospital.    [facility] is not responsible for any belongings or valuables.               Contacts, dentures or bridgework may not be worn into surgery.  Leave your suitcase in the car. After surgery it may be brought to your room.  For patients admitted to the hospital, discharge time is determined by your  treatment team.   Patients discharged the day of surgery will not be allowed to drive home.   Please read over the following fact sheets that you were given:      _X___ Take these medicines the morning of surgery with A SIP OF WATER:    1. PRILOSEC  2. TAKE A PRILOSEC Tuesday NIGHT AT BEDTIME  3.   4.  5.  6.  ____ Fleet Enema (as directed)   ____ Use CHG Soap as directed  _X___ Use inhalers on the day of surgery-USE ALBUTEROL INHALER AT [place]   ____ Stop metformin 2 days prior to surgery    ____ Take 1/2 of usual insulin dose the night before surgery and none on  the morning of surgery.   ____ Stop Coumadin/Plavix/aspirin-N/A  ____ Stop Anti-inflammatories-NO NSAIDS OR ASPIRIN PRODUCTS-TYLENOL OK TO TAKE   ____ Stop supplements until after surgery.    ____ Bring C-Pap to the hospital.

## 2015-08-22 ENCOUNTER — Encounter
Admission: RE | Admit: 2015-08-22 | Discharge: 2015-08-22 | Disposition: A | Discharge status: Home / Self Care | Payer: 59 | Source: Ambulatory Visit | Attending: Urology | Admitting: Urology

## 2015-08-22 DIAGNOSIS — N329 Bladder disorder, unspecified: Secondary | ICD-10-CM | POA: Diagnosis present

## 2015-08-22 DIAGNOSIS — Z01812 Encounter for preprocedural laboratory examination: Secondary | ICD-10-CM | POA: Diagnosis not present

## 2015-08-25 LAB — URINE CULTURE: Culture: 50000

## 2015-08-26 NOTE — Pre-Procedure Instructions (Signed)
Urine culture results faxed to dr Pilar Jarvis

## 2015-08-27 ENCOUNTER — Ambulatory Visit: Admission: RE | Admit: 2015-08-27 | Payer: 59 | Source: Ambulatory Visit | Admitting: Urology

## 2015-08-27 HISTORY — DX: Chronic kidney disease, unspecified: N18.9

## 2015-08-27 HISTORY — DX: Pneumonia, unspecified organism: J18.9

## 2015-08-27 HISTORY — DX: Chronic obstructive pulmonary disease, unspecified: J44.9

## 2015-08-27 SURGERY — CYSTOSCOPY, WITH BIOPSY
Anesthesia: Choice

## 2015-08-28 ENCOUNTER — Other Ambulatory Visit: Payer: Self-pay

## 2015-08-28 ENCOUNTER — Telehealth: Payer: Self-pay | Admitting: Radiology

## 2015-08-28 DIAGNOSIS — N390 Urinary tract infection, site not specified: Secondary | ICD-10-CM

## 2015-08-28 MED ORDER — PENICILLIN V POTASSIUM 500 MG PO TABS: 500.0000 mg | ORAL_TABLET | Freq: Four times a day (QID) | ORAL | Status: AC

## 2015-08-28 NOTE — Telephone Encounter (Signed)
Notified pt that per Dr Pilar Jarvis a prescription for penicillin has been sent to her pharmacy because her urine culture was positive for infection. She is to RTC on 2/27 for repeat ua & ucx prior to surgery that has been rescheduled to 09/17/15. She is to follow the instructions she was given by pre-admit testing prior to surgery & call day prior to surgery for arrival time to [facility]. Pt voices understanding.

## 2015-09-05 ENCOUNTER — Other Ambulatory Visit: Payer: Self-pay

## 2015-09-05 DIAGNOSIS — R310 Gross hematuria: Secondary | ICD-10-CM

## 2015-09-08 ENCOUNTER — Other Ambulatory Visit: Payer: 59

## 2015-09-08 DIAGNOSIS — R310 Gross hematuria: Secondary | ICD-10-CM

## 2015-09-09 LAB — URINALYSIS, COMPLETE
Bilirubin, UA: NEGATIVE
GLUCOSE, UA: NEGATIVE
Ketones, UA: NEGATIVE
Nitrite, UA: NEGATIVE
PROTEIN UA: NEGATIVE
RBC, UA: NEGATIVE
Specific Gravity, UA: 1.01 (ref 1.005–1.030)
UUROB: 0.2 mg/dL (ref 0.2–1.0)
pH, UA: 7 (ref 5.0–7.5)

## 2015-09-09 LAB — MICROSCOPIC EXAMINATION
Bacteria, UA: NONE SEEN
Epithelial Cells (non renal): NONE SEEN /hpf (ref 0–10)
RBC, UA: NONE SEEN /hpf (ref 0–?)
WBC, UA: NONE SEEN /hpf (ref 0–?)

## 2015-09-10 LAB — CULTURE, URINE COMPREHENSIVE

## 2015-09-17 ENCOUNTER — Encounter
Admission: RE | Disposition: A | Discharge status: Home / Self Care | Payer: Self-pay | Source: Ambulatory Visit | Attending: Urology

## 2015-09-17 ENCOUNTER — Encounter: Payer: Self-pay | Admitting: *Deleted

## 2015-09-17 ENCOUNTER — Ambulatory Visit: Payer: 59 | Admitting: Anesthesiology

## 2015-09-17 ENCOUNTER — Ambulatory Visit
Admission: RE | Admit: 2015-09-17 | Discharge: 2015-09-17 | Disposition: A | Discharge status: Home / Self Care | Payer: 59 | Source: Ambulatory Visit | Attending: Urology | Admitting: Urology

## 2015-09-17 DIAGNOSIS — R310 Gross hematuria: Secondary | ICD-10-CM | POA: Diagnosis not present

## 2015-09-17 DIAGNOSIS — R918 Other nonspecific abnormal finding of lung field: Secondary | ICD-10-CM | POA: Diagnosis not present

## 2015-09-17 DIAGNOSIS — I10 Essential (primary) hypertension: Secondary | ICD-10-CM | POA: Diagnosis not present

## 2015-09-17 DIAGNOSIS — N3021 Other chronic cystitis with hematuria: Secondary | ICD-10-CM | POA: Insufficient documentation

## 2015-09-17 DIAGNOSIS — Z886 Allergy status to analgesic agent status: Secondary | ICD-10-CM | POA: Diagnosis not present

## 2015-09-17 DIAGNOSIS — N329 Bladder disorder, unspecified: Secondary | ICD-10-CM | POA: Diagnosis present

## 2015-09-17 DIAGNOSIS — Z79899 Other long term (current) drug therapy: Secondary | ICD-10-CM | POA: Diagnosis not present

## 2015-09-17 DIAGNOSIS — Z820 Family history of epilepsy and other diseases of the nervous system: Secondary | ICD-10-CM | POA: Diagnosis not present

## 2015-09-17 DIAGNOSIS — R351 Nocturia: Secondary | ICD-10-CM | POA: Insufficient documentation

## 2015-09-17 DIAGNOSIS — Z87440 Personal history of urinary (tract) infections: Secondary | ICD-10-CM | POA: Diagnosis not present

## 2015-09-17 DIAGNOSIS — K219 Gastro-esophageal reflux disease without esophagitis: Secondary | ICD-10-CM | POA: Insufficient documentation

## 2015-09-17 DIAGNOSIS — M8580 Other specified disorders of bone density and structure, unspecified site: Secondary | ICD-10-CM | POA: Insufficient documentation

## 2015-09-17 DIAGNOSIS — N3081 Other cystitis with hematuria: Secondary | ICD-10-CM | POA: Diagnosis not present

## 2015-09-17 DIAGNOSIS — K5909 Other constipation: Secondary | ICD-10-CM | POA: Diagnosis not present

## 2015-09-17 DIAGNOSIS — Z8249 Family history of ischemic heart disease and other diseases of the circulatory system: Secondary | ICD-10-CM | POA: Insufficient documentation

## 2015-09-17 HISTORY — PX: CYSTOSCOPY WITH BIOPSY: SHX5122

## 2015-09-17 HISTORY — PX: CYSTOSCOPY WITH FULGERATION: SHX6638

## 2015-09-17 SURGERY — CYSTOSCOPY, WITH BIOPSY
Anesthesia: General | Wound class: Clean Contaminated

## 2015-09-17 MED ORDER — MIDAZOLAM HCL 5 MG/5ML IJ SOLN
INTRAMUSCULAR | Status: DC | PRN
  Administered 2015-09-17: 2 mg via INTRAVENOUS

## 2015-09-17 MED ORDER — PHENYLEPHRINE HCL 10 MG/ML IJ SOLN
INTRAMUSCULAR | Status: DC | PRN
  Administered 2015-09-17: 50 ug via INTRAVENOUS

## 2015-09-17 MED ORDER — HYDROMORPHONE HCL 1 MG/ML IJ SOLN
0.2500 mg | INTRAMUSCULAR | Status: DC | PRN
  Administered 2015-09-17 (×2): 0.25 mg via INTRAVENOUS

## 2015-09-17 MED ORDER — HYDROMORPHONE HCL 1 MG/ML IJ SOLN
INTRAMUSCULAR | Status: AC
  Administered 2015-09-17: 0.25 mg via INTRAVENOUS
  Filled 2015-09-17: qty 1

## 2015-09-17 MED ORDER — CEPHALEXIN 500 MG PO CAPS: 500.0000 mg | ORAL_CAPSULE | Freq: Four times a day (QID) | ORAL | Status: DC

## 2015-09-17 MED ORDER — LACTATED RINGERS IV SOLN
INTRAVENOUS | Status: DC
  Administered 2015-09-17: 16:00:00 via INTRAVENOUS

## 2015-09-17 MED ORDER — CEFAZOLIN SODIUM-DEXTROSE 2-3 GM-% IV SOLR
2.0000 g | Freq: Once | INTRAVENOUS | Status: AC
Start: 2015-09-17 — End: 2015-09-17
  Administered 2015-09-17: 2 g via INTRAVENOUS

## 2015-09-17 MED ORDER — DEXAMETHASONE SODIUM PHOSPHATE 10 MG/ML IJ SOLN
INTRAMUSCULAR | Status: DC | PRN
  Administered 2015-09-17: 5 mg via INTRAVENOUS

## 2015-09-17 MED ORDER — ONDANSETRON HCL 4 MG/2ML IJ SOLN: 4.0000 mg | Freq: Once | INTRAMUSCULAR | Status: DC | PRN

## 2015-09-17 MED ORDER — TRAMADOL HCL 50 MG PO TABS: 50.0000 mg | ORAL_TABLET | Freq: Four times a day (QID) | ORAL | Status: DC | PRN

## 2015-09-17 MED ORDER — TRAMADOL HCL 50 MG PO TABS
ORAL_TABLET | ORAL | Status: AC
  Filled 2015-09-17: qty 1

## 2015-09-17 MED ORDER — PROPOFOL 10 MG/ML IV BOLUS
INTRAVENOUS | Status: DC | PRN
  Administered 2015-09-17: 150 mg via INTRAVENOUS

## 2015-09-17 MED ORDER — CEFAZOLIN SODIUM-DEXTROSE 2-3 GM-% IV SOLR
INTRAVENOUS | Status: AC
  Administered 2015-09-17: 2 g via INTRAVENOUS
  Filled 2015-09-17: qty 50

## 2015-09-17 MED ORDER — FENTANYL CITRATE (PF) 100 MCG/2ML IJ SOLN
INTRAMUSCULAR | Status: DC | PRN
  Administered 2015-09-17: 25 ug via INTRAVENOUS

## 2015-09-17 MED ORDER — LIDOCAINE HCL (CARDIAC) 20 MG/ML IV SOLN
INTRAVENOUS | Status: DC | PRN
  Administered 2015-09-17: 35 mg via INTRAVENOUS

## 2015-09-17 MED ORDER — TRAMADOL HCL 50 MG PO TABS
50.0000 mg | ORAL_TABLET | Freq: Four times a day (QID) | ORAL | Status: DC | PRN
  Administered 2015-09-17: 50 mg via ORAL

## 2015-09-17 MED ORDER — ONDANSETRON HCL 4 MG/2ML IJ SOLN
INTRAMUSCULAR | Status: DC | PRN
  Administered 2015-09-17: 4 mg via INTRAVENOUS

## 2015-09-17 SURGICAL SUPPLY — 34 items
BACTOSHIELD CHG 4% 4OZ (MISCELLANEOUS) ×1
BAG DRAIN CYSTO-URO LG1000N (MISCELLANEOUS) ×2 IMPLANT
BAG URO DRAIN 2000ML W/SPOUT (MISCELLANEOUS) ×2 IMPLANT
CATH FOLEY 2WAY  5CC 16FR (CATHETERS) ×1
CATH URETL 5X70 OPEN END (CATHETERS) ×2 IMPLANT
CATH URTH 16FR FL 2W BLN LF (CATHETERS) ×1 IMPLANT
ELECT LOOP MED HF 24F 12D (CUTTING LOOP) ×2 IMPLANT
ELECT REM PT RETURN 9FT ADLT (ELECTROSURGICAL) ×2
ELECTRODE REM PT RTRN 9FT ADLT (ELECTROSURGICAL) ×1 IMPLANT
EVACUATOR ELLICK (MISCELLANEOUS) ×2 IMPLANT
FORCEPS BIOP PIRANHA Y (CUTTING FORCEPS) ×2 IMPLANT
GLOVE BIO SURGEON STRL SZ7 (GLOVE) ×4 IMPLANT
GLOVE BIO SURGEON STRL SZ7.5 (GLOVE) ×2 IMPLANT
GOWN L4 LG 24 PK N/S (GOWN DISPOSABLE) ×2 IMPLANT
GOWN STRL REUS W/ TWL LRG LVL3 (GOWN DISPOSABLE) ×2 IMPLANT
GOWN STRL REUS W/ TWL XL LVL3 (GOWN DISPOSABLE) ×1 IMPLANT
GOWN STRL REUS W/TWL LRG LVL3 (GOWN DISPOSABLE) ×2
GOWN STRL REUS W/TWL XL LVL3 (GOWN DISPOSABLE) ×1
KIT RM TURNOVER CYSTO AR (KITS) ×2 IMPLANT
LOOP CUT RT ANGL 28F (MISCELLANEOUS) ×2 IMPLANT
PACK CYSTO AR (MISCELLANEOUS) ×2 IMPLANT
PREP PVP WINGED SPONGE (MISCELLANEOUS) ×2 IMPLANT
ROLLER BALL 3MM 24FR (ELECTROSURGICAL) ×2 IMPLANT
SCRUB CHG 4% DYNA-HEX 4OZ (MISCELLANEOUS) ×1 IMPLANT
SENSORWIRE 0.038 NOT ANGLED (WIRE) ×2
SET CYSTO W/LG BORE CLAMP LF (SET/KITS/TRAYS/PACK) ×2 IMPLANT
SET IRRIG Y TYPE TUR BLADDER L (SET/KITS/TRAYS/PACK) ×2 IMPLANT
SOL .9 NS 3000ML IRR  AL (IV SOLUTION) ×1
SOL .9 NS 3000ML IRR UROMATIC (IV SOLUTION) ×1 IMPLANT
SURGILUBE 2OZ TUBE FLIPTOP (MISCELLANEOUS) ×2 IMPLANT
SYRINGE IRR TOOMEY STRL 70CC (SYRINGE) ×2 IMPLANT
WATER STERILE IRR 1000ML POUR (IV SOLUTION) ×6 IMPLANT
WATER STERILE IRR 3000ML UROMA (IV SOLUTION) ×2 IMPLANT
WIRE SENSOR 0.038 NOT ANGLED (WIRE) ×1 IMPLANT

## 2015-09-17 NOTE — Anesthesia Preprocedure Evaluation (Signed)
Anesthesia Evaluation  Patient identified by MRN, date of birth, ID band Patient awake    Reviewed: Allergy & Precautions, NPO status , Patient's Chart, lab work & pertinent test results  Airway Mallampati: I  TM Distance: >3 FB Neck ROM: Full    Dental  (+) Teeth Intact   Pulmonary pneumonia, resolved, COPD,  COPD inhaler,    Pulmonary exam normal        Cardiovascular Exercise Tolerance: Good hypertension, Pt. on medications Normal cardiovascular exam     Neuro/Psych    GI/Hepatic GERD  Medicated and Controlled,  Endo/Other    Renal/GU Renal diseaseHx of stones.     Musculoskeletal   Abdominal Normal abdominal exam  (+)   Peds  Hematology   Anesthesia Other Findings   Reproductive/Obstetrics                             Anesthesia Physical Anesthesia Plan  ASA: II  Anesthesia Plan: General   Post-op Pain Management:    Induction: Intravenous  Airway Management Planned: LMA  Additional Equipment:   Intra-op Plan:   Post-operative Plan: Extubation in OR  Informed Consent: I have reviewed the patients History and Physical, chart, labs and discussed the procedure including the risks, benefits and alternatives for the proposed anesthesia with the patient or authorized representative who has indicated his/her understanding and acceptance.     Plan Discussed with: CRNA  Anesthesia Plan Comments:         Anesthesia Quick Evaluation

## 2015-09-17 NOTE — Transfer of Care (Signed)
Immediate Anesthesia Transfer of Care Note  Patient: Wendy Ashley  Procedure(s) Performed: Procedure(s): CYSTOSCOPY WITH BIOPSY (N/A) CYSTOSCOPY WITH FULGERATION (N/A)  Patient Location: PACU  Anesthesia Type:General  Level of Consciousness: sedated  Airway & Oxygen Therapy: Patient Spontanous Breathing and Patient connected to face mask oxygen  Post-op Assessment: Report given to RN  Post vital signs: Reviewed and stable  Last Vitals:  Filed Vitals:   09/17/15 1351 09/17/15 1639  BP: 138/103 135/82  Pulse: 71 63  Temp: 35.1 C 36.2 C  Resp: 16 11    Complications: No apparent anesthesia complications

## 2015-09-17 NOTE — H&P (Signed)
Expand All Collapse All       Wendy Ashley August 11, 1955 [id number]  Referring provider: Kathrine Haddock, NP [place], [place] [address]  Chief Complaint  Patient presents with  . Cysto    gross hematuria, recurrent UTI    HPI: Patient is a 60 year old female presenting today as a referral from her primary care provider complaints of recurrent urinary tract infections for the last year. She reports recurrent symptoms of dysuria, frequency, few episodes of gross hematuria, nocturia up to 2 times per night and suprapubic discomfort.  Patient reports UTI occuring once per month for the last year. She states that she is unsure if previous specimens were sent for culture. 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  Urinary symptoms include urinary frequency, nocturia 2 times per night, intermittent dysuria. Patient reports that she did notice  Patient states that she finished Cipro yesterday.  H2O intake 4-5 bottle of water daily. No history of renal stones.  No vaginal complaints.  Interval history: The patient follows up for completion of her hematuria workup. Her CT was negative for any GU pathology. She did have lung nodules noted though.  PMH: Past Medical History  Diagnosis Date  . Chronic constipation   . Hypertension   . GERD (gastroesophageal reflux disease)   . Osteopenia     Surgical History: Past Surgical History  Procedure Laterality Date  . Tubal ligation      Home Medications:    Medication List       This list is accurate as of: 07/31/15 12:19 PM. Always use your most recent med list.              omeprazole 20 MG capsule  Commonly known as: PRILOSEC  Take 20 mg by mouth daily.     predniSONE 10 MG tablet  Commonly known as: DELTASONE  6 tabs today, 5 tabs tomorrow, decrease by 1 each day until gone      valACYclovir 1000 MG tablet  Commonly known as: VALTREX  Take 2 tablets (2,000 mg total) by mouth 2 (two) times daily. For 1 day, then 1/2 tab daily after that        Allergies:  Allergies  Allergen Reactions  . Tylenol [Acetaminophen]     Family History: Family History  Problem Relation Age of Onset  . Hypertension Mother   . Alzheimer's disease Father     Social History:  reports that she has never smoked. She does not have any smokeless tobacco history on file. She reports that she does not drink alcohol or use illicit drugs.  ROS:                                        Physical Exam: BP 130/91 mmHg  Pulse 71  Ht 5\' 2"  (1.575 m)  Wt 116 lb 9.6 oz (52.889 kg)  BMI 21.32 kg/m2  LMP (LMP Unknown)  Constitutional: Alert and oriented, No acute distress. HEENT:  AT, moist mucus membranes. Trachea midline, no masses. Cardiovascular: No clubbing, cyanosis, or edema. RRR Respiratory: Normal respiratory effort, no increased work of breathing. GI: Abdomen is soft, nontender, nondistended, no abdominal masses GU: No CVA tenderness.  Skin: No rashes, bruises or suspicious lesions. Lymph: No cervical or inguinal adenopathy. Neurologic: Grossly intact, no focal deficits, moving all 4 extremities.  Psychiatric: Normal mood and affect.  Laboratory Data:  Recent Labs    Lab Results  Component Value Date   WBC 6.2 06/04/2013   HGB 14.3 06/04/2013   HCT 41.2 06/04/2013   MCV 89 06/04/2013   PLT 202 06/04/2013       Recent Labs    Lab Results  Component Value Date   CREATININE 0.85 06/04/2013       Recent Labs    No results found for: PSA     Recent Labs    No results found for: TESTOSTERONE     Recent Labs    No results found for: HGBA1C    Urinalysis  Labs (Brief)       Component Value Date/Time   COLORURINE Straw 06/04/2013 1654   APPEARANCEUR Clear  06/04/2013 1654   LABSPEC 1.006 06/04/2013 1654   PHURINE 7.0 06/04/2013 1654   GLUCOSEU Negative 05/22/2015 1452   GLUCOSEU Negative 06/04/2013 1654   HGBUR Negative 06/04/2013 1654   BILIRUBINUR Negative 05/22/2015 1452   BILIRUBINUR Negative 06/04/2013 1654   KETONESUR Negative 06/04/2013 1654   PROTEINUR Negative 06/04/2013 1654   NITRITE Negative 05/22/2015 1452   NITRITE Negative 06/04/2013 1654   LEUKOCYTESUR Negative 05/22/2015 1452   LEUKOCYTESUR Negative 06/04/2013 1654      Pertinent Imaging: Study Result     CLINICAL DATA: Gross hematuria for 1 month. Chronic urinary tract infections for 6 months.  EXAM: CT ABDOMEN AND PELVIS WITHOUT AND WITH CONTRAST  TECHNIQUE: Multidetector CT imaging of the abdomen and pelvis was performed following the standard protocol before and following the bolus administration of intravenous contrast.  CONTRAST: 162mL OMNIPAQUE IOHEXOL 350 MG/ML SOLN  COMPARISON: CT abdomen 06/04/2013  FINDINGS: Lower chest: Subpleural nodule in the mid LEFT lower lobe measures 4 mm on image 5, series 2. No comparison available.  Hepatobiliary: No focal hepatic lesion. No biliary duct dilatation. Gallbladder is normal. Common bile duct is normal.  Pancreas: Pancreas is normal. No ductal dilatation. No pancreatic inflammation.  Spleen: Normal spleen  Adrenals/urinary tract: Adrenal glands normal.  Non IV contrast images demonstrate no nephrolithiasis or ureterolithiasis. Cortical phase imaging demonstrates no enhancing renal cortical. Delayed pyelogram phase imaging demonstrates no filling defects complex cyst ureters.  No bladder calculi. No filling defect the bladder.  Small amount of gas the bladder presumably related to instrumentation.  Stomach/Bowel: Stomach, small-bowel, appendix, and cecum normal. The colon and rectosigmoid colon are  normal.  Vascular/Lymphatic: Abdominal aorta is normal caliber. There is no retroperitoneal or periportal lymphadenopathy. No pelvic lymphadenopathy.  Reproductive: Small enhancing focus along the endometrium measuring 4 mm is unchanged from prior likely represents a submucosal leiomyoma. Ovaries are normal.  Other: No free fluid.  Musculoskeletal: No aggressive osseous lesion.  IMPRESSION: 1. No explanation for hematuria. No nephrolithiasis, ureterolithiasis, enhancing renal cortical lesion, or filling defects within the collecting systems. 2. Small amount a gas the bladder presumably related to a instrumentation. 3. 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 4. LEFT lower lobe pulmonary nodule. If the patient is at high risk for bronchogenic carcinoma, follow-up chest CT at 1 year is recommended. If the patient is at low risk, no follow-up is needed. This recommendation follows the consensus statement: Guidelines for Management of Small Pulmonary Nodules Detected on CT Scans: A Statement from the [place] as published in Radiology 2005; 237:395-400.      Cystoscopy Procedure Note  Patient identification was confirmed, informed consent was obtained, and patient was prepped using Betadine solution.  Lidocaine jelly was administered per urethral meatus.   Preoperative abx where received prior to procedure.   Procedure: - Flexible cystoscope introduced, without any difficulty.  - Thorough search of the bladder revealed:  normal urethral meatus  normal urothelium  Hypervascular, erythematous lesions along the posterior bladder wall  - Ureteral orifices were normal in position and appearance.  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 I discussed cystoscopy and bladder biopsy with the patient in great  detail. All risks, benefits and indications of procedure prescribed great detail. The patient consented to the procedure.  1. Bladder lesion -cystoscopy, bladder biopsy  2. Lung nodule The patient was given a copy of her CT scan report instructed to discuss it with her primary care doctor. She is aware that she may need a repeat CT scan in one year. She will discuss this with her primary care doctor.  No Follow-up on file.  Nickie Retort, MD  [facility] [address], [place], [place] [address] [phone]

## 2015-09-17 NOTE — Discharge Instructions (Signed)

## 2015-09-17 NOTE — Anesthesia Procedure Notes (Signed)
Procedure Name: LMA Insertion Date/Time: 09/17/2015 4:04 PM Performed by: Dionne Bucy Pre-anesthesia Checklist: Patient identified, Patient being monitored, Timeout performed, Emergency Drugs available and Suction available Patient Re-evaluated:Patient Re-evaluated prior to inductionOxygen Delivery Method: Circle system utilized Preoxygenation: Pre-oxygenation with 100% oxygen Intubation Type: IV induction Ventilation: Mask ventilation without difficulty LMA: LMA inserted LMA Size: 4.0 Tube type: Oral Number of attempts: 1 Placement Confirmation: positive ETCO2 and breath sounds checked- equal and bilateral Tube secured with: Tape Dental Injury: Teeth and Oropharynx as per pre-operative assessment

## 2015-09-17 NOTE — Anesthesia Postprocedure Evaluation (Signed)
Anesthesia Post Note  Patient: Wendy Ashley  Procedure(s) Performed: Procedure(s) (LRB): CYSTOSCOPY WITH BIOPSY (N/A) CYSTOSCOPY WITH FULGERATION (N/A)  Patient location during evaluation: PACU Anesthesia Type: General Level of consciousness: awake Pain management: pain level controlled Vital Signs Assessment: post-procedure vital signs reviewed and stable Respiratory status: spontaneous breathing Cardiovascular status: blood pressure returned to baseline Anesthetic complications: no    Last Vitals:  Filed Vitals:   09/17/15 1351 09/17/15 1639  BP: 138/103 135/82  Pulse: 71 63  Temp: 35.1 C 36.2 C  Resp: 16 11    Last Pain: There were no vitals filed for this visit.               VAN STAVEREN,Nickisha Hum

## 2015-09-17 NOTE — Op Note (Signed)
Date of procedure: 09/17/2015  Preoperative diagnosis:  1. Bladder lesion 2.  Gross hematuria  Postoperative diagnosis:  1. Bladder lesion 2. Gross hematuria   Procedure: 1. Cystoscopy 2. Bladder biopsy 3 3. 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 This was biopsied 3 and the area was fulgurated.  EBL: None  Specimens: Posterior bladder wall lesion 3  Drains: None  Disposition: Stable to the postanesthesia care unit  Indication for procedure: The patient is a 60 y.o. 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  After reviewing the management options for treatment, the patient elected to proceed with the above surgical procedure(s). We have discussed the potential benefits and risks of the procedure, side effects of the proposed treatment, the likelihood of the patient achieving the goals of the procedure, and any potential problems that might occur during the procedure or recuperation. Informed consent has been obtained.  Description of procedure: The patient was met in the preoperative area. All risks, benefits, and indications of the procedure were described in great detail. The patient consented to the procedure. Preoperative antibiotics were given. The patient was taken to the operative theater. General anesthesia was induced per the anesthesia service. The patient was then placed in the dorsal lithotomy position and prepped and draped in the usual sterile fashion. A preoperative timeout was called. A 21 French 30 cystoscope was inserted into the patient's bladder per urethra atraumatically. The previously noticed approximately 5 cm flat erythematous hypervascular lesion in the posterior  wall of the bladder was visualized. Pan cystoscopy was otherwise unremarkable. This lesion was biopsied 3 with flexible graspers. These areas were then fulgurated as well as the remaining lesion was fulgurated. Hemostasis was excellent. Patient's bladder was drained and she was woken from anesthesia and transferred stable condition to the postanesthesia care unit.  Plan: The patient will follow-up in one week for pathology results.  Baruch Gouty, M.D.

## 2015-09-18 ENCOUNTER — Telehealth: Payer: Self-pay

## 2015-09-18 ENCOUNTER — Encounter: Payer: Self-pay | Admitting: Urology

## 2015-09-18 NOTE — Telephone Encounter (Signed)
-----   Message from Nickie Retort, MD sent at 09/17/2015  4:31 PM EST ----- Patient needs to see me in one week for pathology results

## 2015-09-22 ENCOUNTER — Other Ambulatory Visit: Payer: 59

## 2015-09-22 DIAGNOSIS — R300 Dysuria: Secondary | ICD-10-CM

## 2015-09-22 LAB — URINALYSIS, COMPLETE
BILIRUBIN UA: NEGATIVE
GLUCOSE, UA: NEGATIVE
Ketones, UA: NEGATIVE
NITRITE UA: NEGATIVE
PH UA: 5.5 (ref 5.0–7.5)
Protein, UA: NEGATIVE
Specific Gravity, UA: <1.005 — ABNORMAL LOW (ref 1.005–1.030)
UUROB: 0.2 mg/dL (ref 0.2–1.0)

## 2015-09-22 LAB — SURGICAL PATHOLOGY

## 2015-09-22 LAB — MICROSCOPIC EXAMINATION: Bacteria, UA: NONE SEEN

## 2015-09-24 LAB — CULTURE, URINE COMPREHENSIVE

## 2015-09-26 ENCOUNTER — Encounter: Payer: Self-pay | Admitting: Urology

## 2015-09-26 ENCOUNTER — Ambulatory Visit (INDEPENDENT_AMBULATORY_CARE_PROVIDER_SITE_OTHER): Payer: 59 | Admitting: Urology

## 2015-09-26 VITALS — BP 147/99 | HR 83 | Ht 62.0 in | Wt 117.1 lb

## 2015-09-26 DIAGNOSIS — N3020 Other chronic cystitis without hematuria: Secondary | ICD-10-CM

## 2015-09-26 DIAGNOSIS — R310 Gross hematuria: Secondary | ICD-10-CM | POA: Diagnosis not present

## 2015-09-26 DIAGNOSIS — N390 Urinary tract infection, site not specified: Secondary | ICD-10-CM

## 2015-09-26 NOTE — Progress Notes (Signed)
09/26/2015 11:55 AM   Wendy Ashley 01/07/1956 [id number]  Referring provider: Kathrine Haddock, NP [place], [place] [address]  Chief Complaint  Patient presents with  . Follow-up    pathology report     HPI: Patient is a 60 year old female presenting today as a referral from her primary care provider complaints of recurrent urinary tract infections for the last year. She reports recurrent symptoms of dysuria, frequency, few episodes of gross hematuria, nocturia up to 2 times per night and suprapubic discomfort.  Patient reports UTI occuring once per month for the last year. She states that she is unsure if previous specimens were sent for culture. 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  Urinary symptoms include urinary frequency, nocturia 2 times per night, intermittent dysuria. Patient reports that she did notice  Patient states that she finished Cipro yesterday.  H2O intake 4-5 bottle of water daily. No history of renal stones.  No vaginal complaints.  Her CT was negative for any GU pathology. She did have lung nodules noted though.   She also had a hypervascular erythematous area in the posterior wall of her bladder and underwent cystoscopy with biopsy. Her pathology showed only inflammation with no sign of malignancy.   PMH: Past Medical History  Diagnosis Date  . Chronic constipation   . GERD (gastroesophageal reflux disease)   . Osteopenia   . Pneumonia   . COPD (chronic obstructive pulmonary disease) (HCC)     mild  . Chronic kidney disease     h/o kidney stones    Surgical History: Past Surgical History  Procedure Laterality Date  . Tubal ligation    . Cystoscopy with biopsy N/A 09/17/2015    Procedure: CYSTOSCOPY WITH BIOPSY;  Surgeon: Nickie Retort, MD;  Location: [facility] ORS;  Service: Urology;  Laterality: N/A;  . Cystoscopy with fulgeration N/A 09/17/2015   Procedure: CYSTOSCOPY WITH FULGERATION;  Surgeon: Nickie Retort, MD;  Location: [facility] ORS;  Service: Urology;  Laterality: N/A;    Home Medications:    Medication List       This list is accurate as of: 09/26/15 11:55 AM.  Always use your most recent med list.               albuterol 108 (90 Base) MCG/ACT inhaler  Commonly known as:  PROVENTIL HFA;VENTOLIN HFA  Inhale 2 puffs into the lungs every 6 (six) hours as needed for wheezing or shortness of breath.     cephALEXin 500 MG capsule  Commonly known as:  KEFLEX  Take 1 capsule (500 mg total) by mouth 4 (four) times daily.     omeprazole 20 MG capsule  Commonly known as:  PRILOSEC  Take 20 mg by mouth as needed.     traMADol 50 MG tablet  Commonly known as:  ULTRAM  Take 1 tablet (50 mg total) by mouth every 6 (six) hours as needed.        Allergies:  Allergies  Allergen Reactions  . Tylenol [Acetaminophen]   . Ciprofloxacin Swelling and Rash    Family History: Family History  Problem Relation Age of Onset  . Hypertension Mother   . Alzheimer's disease Father   . Cancer Father     colon    Social History:  reports that she has never smoked. She has never used smokeless tobacco. She reports that she drinks alcohol. She reports that she  does not use illicit drugs.  ROS: UROLOGY Frequent Urination?: No Hard to postpone urination?: No Burning/pain with urination?: Yes Get up at night to urinate?: No Leakage of urine?: No Urine stream starts and stops?: No Trouble starting stream?: No Do you have to strain to urinate?: No Blood in urine?: Yes Urinary tract infection?: No Sexually transmitted disease?: No Injury to kidneys or bladder?: No Painful intercourse?: No Weak stream?: No Currently pregnant?: No Vaginal bleeding?: No Last menstrual period?: n  Gastrointestinal Nausea?: No Vomiting?: No Indigestion/heartburn?: No Diarrhea?: No Constipation?: No  Constitutional Fever: No Night  sweats?: No Weight loss?: No Fatigue?: No  Skin Skin rash/lesions?: No Itching?: No  Eyes Blurred vision?: No Double vision?: No  Ears/Nose/Throat Sore throat?: No Sinus problems?: No  Hematologic/Lymphatic Swollen glands?: No Easy bruising?: No  Cardiovascular Leg swelling?: No Chest pain?: No  Respiratory Cough?: No Shortness of breath?: No  Endocrine Excessive thirst?: No  Musculoskeletal Back pain?: No Joint pain?: No  Neurological Headaches?: No Dizziness?: No  Psychologic Depression?: No Anxiety?: No  Physical Exam: BP 147/99 mmHg  Pulse 83  Ht 5\' 2"  (1.575 m)  Wt 117 lb 1.6 oz (53.116 kg)  BMI 21.41 kg/m2  LMP  (LMP Unknown)  Constitutional:  Alert and oriented, No acute distress. HEENT: [place] AT, moist mucus membranes.  Trachea midline, no masses. Cardiovascular: No clubbing, cyanosis, or edema. Respiratory: Normal respiratory effort, no increased work of breathing. GI: Abdomen is soft, nontender, nondistended, no abdominal masses GU: No CVA tenderness.  Skin: No rashes, bruises or suspicious lesions. Lymph: No cervical or inguinal adenopathy. Neurologic: Grossly intact, no focal deficits, moving all 4 extremities. Psychiatric: Normal mood and affect.  Laboratory Data: Lab Results  Component Value Date   WBC 6.2 06/04/2013   HGB 14.3 06/04/2013   HCT 41.2 06/04/2013   MCV 89 06/04/2013   PLT 202 06/04/2013    Lab Results  Component Value Date   CREATININE 0.85 06/04/2013    No results found for: PSA  No results found for: TESTOSTERONE  No results found for: HGBA1C  Urinalysis    Component Value Date/Time   COLORURINE Straw 06/04/2013 1654   APPEARANCEUR Clear 09/22/2015 1220   APPEARANCEUR Clear 06/04/2013 1654   LABSPEC 1.006 06/04/2013 1654   PHURINE 7.0 06/04/2013 1654   GLUCOSEU Negative 09/22/2015 1220   GLUCOSEU Negative 06/04/2013 1654   HGBUR Negative 06/04/2013 1654   BILIRUBINUR Negative 09/22/2015 1220    BILIRUBINUR Negative 06/04/2013 1654   KETONESUR Negative 06/04/2013 1654   PROTEINUR Negative 09/22/2015 1220   PROTEINUR Negative 06/04/2013 1654   NITRITE Negative 09/22/2015 1220   NITRITE Negative 06/04/2013 1654   LEUKOCYTESUR 1+* 09/22/2015 1220   LEUKOCYTESUR Negative 06/04/2013 1654    Assessment & Plan:    1. Gross hematuria -negative workup including biopsy of suspicious bladder mass. The patient will return for her urinalysis in one year.   2. 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 I informed the patient that she should let us know if she develops symptoms of urinary tract infection.  We will have her come to the office for a urine culture. I recommend against routine checking of her urine with urine dipsticks at her office.  Return in about 1 year (around 09/25/2016) for repeat urinalysis.  Nickie Retort, MD  [address] [address], [place], [place] [address] [phone]

## 2015-10-21 ENCOUNTER — Encounter: Payer: Self-pay | Admitting: Family Medicine

## 2015-10-21 ENCOUNTER — Ambulatory Visit (INDEPENDENT_AMBULATORY_CARE_PROVIDER_SITE_OTHER): Payer: 59 | Admitting: Family Medicine

## 2015-10-21 VITALS — BP 129/81 | HR 70 | Temp 98.6°F | Ht 61.6 in | Wt 118.0 lb

## 2015-10-21 DIAGNOSIS — M545 Low back pain, unspecified: Secondary | ICD-10-CM

## 2015-10-21 DIAGNOSIS — R300 Dysuria: Secondary | ICD-10-CM

## 2015-10-21 LAB — UA/M W/RFLX CULTURE, ROUTINE
BILIRUBIN UA: NEGATIVE
Glucose, UA: NEGATIVE
KETONES UA: NEGATIVE
Leukocytes, UA: NEGATIVE
NITRITE UA: NEGATIVE
Protein, UA: NEGATIVE
RBC UA: NEGATIVE
UUROB: 0.2 mg/dL (ref 0.2–1.0)
pH, UA: 6 (ref 5.0–7.5)

## 2015-10-21 MED ORDER — NAPROXEN 500 MG PO TABS: 500.0000 mg | ORAL_TABLET | Freq: Two times a day (BID) | ORAL | Status: DC

## 2015-10-21 MED ORDER — CYCLOBENZAPRINE HCL 10 MG PO TABS
10.0000 mg | ORAL_TABLET | Freq: Every day | ORAL | Status: DC
Start: 2015-10-21 — End: 2017-02-16

## 2015-10-21 NOTE — Progress Notes (Signed)
BP 129/81 mmHg  Pulse 70  Temp(Src) 98.6 F (37 C)  Ht 5' 1.6" (1.565 m)  Wt 118 lb (53.524 kg)  BMI 21.85 kg/m2  SpO2 100%  LMP  (LMP Unknown)   Subjective:    Patient ID: Wendy Ashley, female    DOB: 17-Mar-1956, 60 y.o.   MRN: [id number]  HPI: Wendy Ashley is a 60 y.o. female  Chief Complaint  Patient presents with  . Back Pain   BACK PAIN Duration: about 2 weeks Mechanism of injury: unknown Location: bilateral and low back Onset: gradual Severity: severe Quality: dull Frequency: constant Radiation: around her back into her belly Aggravating factors: none Alleviating factors: NSAIDs Status: better Treatments attempted: rest and ibuprofen  Relief with NSAIDs?: moderate Nighttime pain:  yes Paresthesias / decreased sensation:  no Bowel / bladder incontinence:  no Fevers:  no Dysuria / urinary frequency:  yes   URINARY SYMPTOMS Duration: Many months- has been following with urology. Has not had any changes in her urine since then Dysuria: yes Urinary frequency: yes Urgency: yes  Relevant past medical, surgical, family and social history reviewed and updated as indicated. Interim medical history since our last visit reviewed. Allergies and medications reviewed and updated.  Review of Systems  Constitutional: Negative.   Respiratory: Negative.   Cardiovascular: Negative.   Genitourinary: Positive for dysuria, urgency, frequency and flank pain. Negative for hematuria, decreased urine volume, vaginal bleeding, vaginal discharge, enuresis, difficulty urinating, genital sores, vaginal pain, menstrual problem, pelvic pain and dyspareunia.  Psychiatric/Behavioral: Negative.     Per HPI unless specifically indicated above     Objective:    BP 129/81 mmHg  Pulse 70  Temp(Src) 98.6 F (37 C)  Ht 5' 1.6" (1.565 m)  Wt 118 lb (53.524 kg)  BMI 21.85 kg/m2  SpO2 100%  LMP  (LMP Unknown)  Wt Readings from Last 3 Encounters:  10/21/15 118 lb (53.524 kg)   09/26/15 117 lb 1.6 oz (53.116 kg)  08/19/15 119 lb 6.4 oz (54.159 kg)    Physical Exam  Constitutional: She is oriented to person, place, and time. She appears well-developed and well-nourished. No distress.  HENT:  Head: Normocephalic and atraumatic.  Right Ear: Hearing normal.  Left Ear: Hearing normal.  Nose: Nose normal.  Eyes: Conjunctivae and lids are normal. Right eye exhibits no discharge. Left eye exhibits no discharge. No scleral icterus.  Pulmonary/Chest: Effort normal. No respiratory distress.  Abdominal: Soft. Bowel sounds are normal. She exhibits no distension and no mass. There is tenderness (to RLQ, states that this is chronic). There is no rebound and no guarding.  Neurological: She is alert and oriented to person, place, and time.  Skin: Skin is warm, dry and intact. No rash noted. No erythema. No pallor.  Psychiatric: She has a normal mood and affect. Her speech is normal and behavior is normal. Judgment and thought content normal. Cognition and memory are normal.  Nursing note and vitals reviewed. Back Exam:    Inspection:  Normal spinal curvature.  No deformity, ecchymosis, erythema, or lesions     Palpation:     Midline spinal tenderness: no      Paralumbar tenderness: yes bilateral     Parathoracic tenderness: no      Buttocks tenderness: no     Range of Motion:      Flexion: Normal     Extension:Normal     Lateral bending:Normal    Rotation:Normal    Neuro Exam:Lower extremity DTRs normal &  symmetric.  Strength and sensation intact.    Special Tests:      Straight leg raise:negative  Results for orders placed or performed in visit on 09/22/15  CULTURE, URINE COMPREHENSIVE  Result Value Ref Range   Urine Culture, Comprehensive Final report (A)    Result 1 Comment (A)    Result 2 Lactobacillus species   Microscopic Examination  Result Value Ref Range   WBC, UA 0-5 0 -  5 /hpf   RBC, UA 3-10 (A) 0 -  2 /hpf   Epithelial Cells (non renal) 0-10 0 - 10  /hpf   Bacteria, UA None seen None seen/Few  Urinalysis, Complete  Result Value Ref Range   Specific Gravity, UA <1.005 (L) 1.005 - 1.030   pH, UA 5.5 5.0 - 7.5   Color, UA Yellow Yellow   Appearance Ur Clear Clear   Leukocytes, UA 1+ (A) Negative   Protein, UA Negative Negative/Trace   Glucose, UA Negative Negative   Ketones, UA Negative Negative   RBC, UA 2+ (A) Negative   Bilirubin, UA Negative Negative   Urobilinogen, Ur 0.2 0.2 - 1.0 mg/dL   Nitrite, UA Negative Negative   Microscopic Examination See below:       Assessment & Plan:   Problem List Items Addressed This Visit    None    Visit Diagnoses    Bilateral low back pain without sciatica    -  Primary    Will treat with naproxen and flexeril at night. Exercises given. Recheck 2 weeks to make sure getting better.     Relevant Medications    naproxen (NAPROSYN) 500 MG tablet    cyclobenzaprine (FLEXERIL) 10 MG tablet    Dysuria        UA negative today. Continue to follow with urology for chronic cystitis.     Relevant Orders    UA/M w/rflx Culture, Routine        Follow up plan: Return As scheduled for physical.

## 2015-10-21 NOTE — Patient Instructions (Signed)

## 2015-11-04 ENCOUNTER — Encounter: Payer: Self-pay | Admitting: Unknown Physician Specialty

## 2015-11-04 ENCOUNTER — Ambulatory Visit (INDEPENDENT_AMBULATORY_CARE_PROVIDER_SITE_OTHER): Payer: 59 | Admitting: Unknown Physician Specialty

## 2015-11-04 VITALS — BP 112/77 | HR 75 | Temp 97.5°F | Ht 61.5 in | Wt 117.0 lb

## 2015-11-04 DIAGNOSIS — Z0000 Encounter for general adult medical examination without abnormal findings: Secondary | ICD-10-CM | POA: Diagnosis not present

## 2015-11-04 DIAGNOSIS — R319 Hematuria, unspecified: Secondary | ICD-10-CM | POA: Diagnosis not present

## 2015-11-04 DIAGNOSIS — Z23 Encounter for immunization: Secondary | ICD-10-CM

## 2015-11-04 NOTE — Progress Notes (Signed)
BP 112/77 mmHg  Pulse 75  Temp(Src) 97.5 F (36.4 C)  Ht 5' 1.5" (1.562 m)  Wt 117 lb (53.071 kg)  BMI 21.75 kg/m2  SpO2 99%  LMP  (LMP Unknown)   Subjective:    Patient ID: Wendy Ashley, female    DOB: 08-Aug-1955, 60 y.o.   MRN: [id number]  HPI: Wendy Ashley is a 60 y.o. female  Chief Complaint  Patient presents with  . Annual Exam    orders placed for HIV and Hep C, patient would like vitamin d checked   Social History   Social History  . Marital Status: Married    Spouse Name: N/A  . Number of Children: N/A  . Years of Education: N/A   Occupational History  . Not on file.   Social History Main Topics  . Smoking status: Never Smoker   . Smokeless tobacco: Never Used  . Alcohol Use: 0.0 oz/week    0 Standard drinks or equivalent per week     Comment: on occasion  . Drug Use: No  . Sexual Activity: Yes   Other Topics Concern  . Not on file   Social History Narrative   Family History  Problem Relation Age of Onset  . Hypertension Mother   . Alzheimer's disease Father   . Cancer Father     colon  . Hypertension Sister   . Hypertension Brother   . Hypertension Sister    Past Surgical History  Procedure Laterality Date  . Tubal ligation    . Cystoscopy with biopsy N/A 09/17/2015    Procedure: CYSTOSCOPY WITH BIOPSY;  Surgeon: Nickie Retort, MD;  Location: [facility] ORS;  Service: Urology;  Laterality: N/A;  . Cystoscopy with fulgeration N/A 09/17/2015    Procedure: CYSTOSCOPY WITH FULGERATION;  Surgeon: Nickie Retort, MD;  Location: [facility] ORS;  Service: Urology;  Laterality: N/A;    Relevant past medical, surgical, family and social history reviewed and updated as indicated. Interim medical history since our last visit reviewed. Allergies and medications reviewed and updated.  Review of Systems  Constitutional: Negative.   HENT: Negative.   Eyes: Negative.   Respiratory: Negative.   Cardiovascular: Negative.   Gastrointestinal: Negative.    Endocrine: Negative.   Genitourinary: Negative.        History of hematuria.  Work-up done by urology  Musculoskeletal:       Back pain from 2 weeks ago is better  Skin: Negative.   Allergic/Immunologic: Negative.   Neurological: Negative.   Hematological: Negative.   Psychiatric/Behavioral: Negative.     Per HPI unless specifically indicated above     Objective:    BP 112/77 mmHg  Pulse 75  Temp(Src) 97.5 F (36.4 C)  Ht 5' 1.5" (1.562 m)  Wt 117 lb (53.071 kg)  BMI 21.75 kg/m2  SpO2 99%  LMP  (LMP Unknown)  Wt Readings from Last 3 Encounters:  11/04/15 117 lb (53.071 kg)  10/21/15 118 lb (53.524 kg)  09/26/15 117 lb 1.6 oz (53.116 kg)    Physical Exam  Constitutional: She is oriented to person, place, and time. She appears well-developed and well-nourished.  HENT:  Head: Normocephalic and atraumatic.  Eyes: Pupils are equal, round, and reactive to light. Right eye exhibits no discharge. Left eye exhibits no discharge. No scleral icterus.  Neck: Normal range of motion. Neck supple. Carotid bruit is not present. No thyromegaly present.  Cardiovascular: Normal rate, regular rhythm and normal heart sounds.  Exam reveals  no gallop and no friction rub.   No murmur heard. Pulmonary/Chest: Effort normal and breath sounds normal. No respiratory distress. She has no wheezes. She has no rales.  Abdominal: Soft. Bowel sounds are normal. There is no tenderness. There is no rebound.  Genitourinary: No breast swelling, tenderness or discharge.  Musculoskeletal: Normal range of motion.  Lymphadenopathy:    She has no cervical adenopathy.  Neurological: She is alert and oriented to person, place, and time.  Skin: Skin is warm, dry and intact. No rash noted.  Psychiatric: She has a normal mood and affect. Her speech is normal and behavior is normal. Judgment and thought content normal. Cognition and memory are normal.      Assessment & Plan:   Problem List Items Addressed This  Visit      Unprioritized   Hematuria    Other Visit Diagnoses    Need for diphtheria-tetanus-pertussis (Tdap) vaccine, adult/adolescent    -  Primary    Relevant Orders    Tdap vaccine greater than or equal to 7yo IM (Completed)    Health care maintenance        Relevant Orders    Hepatitis C antibody    HIV antibody    CBC with Differential/Platelet    Comprehensive metabolic panel    Hepatitis C antibody    HIV antibody    Lipid Panel w/o Chol/HDL Ratio    TSH    VITAMIN D 25 Hydroxy (Vit-D Deficiency, Fractures)    Ambulatory referral to Gastroenterology        Follow up plan: Return in about 1 year (around 11/03/2016).

## 2015-11-05 LAB — COMPREHENSIVE METABOLIC PANEL
A/G RATIO: 1.8 (ref 1.2–2.2)
ALK PHOS: 74 IU/L (ref 39–117)
ALT: 17 IU/L (ref 0–32)
AST: 21 IU/L (ref 0–40)
Albumin: 4.4 g/dL (ref 3.5–5.5)
BUN/Creatinine Ratio: 16 (ref 9–23)
BUN: 12 mg/dL (ref 6–24)
Bilirubin Total: 0.4 mg/dL (ref 0.0–1.2)
CO2: 27 mmol/L (ref 18–29)
Calcium: 9.6 mg/dL (ref 8.7–10.2)
Chloride: 102 mmol/L (ref 96–106)
Creatinine, Ser: 0.73 mg/dL (ref 0.57–1.00)
GFR calc Af Amer: 104 mL/min/{1.73_m2} (ref >59)
GFR calc non Af Amer: 90 mL/min/{1.73_m2} (ref >59)
GLOBULIN, TOTAL: 2.5 g/dL (ref 1.5–4.5)
Glucose: 78 mg/dL (ref 65–99)
POTASSIUM: 3.9 mmol/L (ref 3.5–5.2)
SODIUM: 142 mmol/L (ref 134–144)
Total Protein: 6.9 g/dL (ref 6.0–8.5)

## 2015-11-05 LAB — CBC WITH DIFFERENTIAL/PLATELET
Basophils Absolute: 0 10*3/uL (ref 0.0–0.2)
Basos: 1 %
EOS (ABSOLUTE): 0.2 10*3/uL (ref 0.0–0.4)
EOS: 3 %
HEMATOCRIT: 41.7 % (ref 34.0–46.6)
Hemoglobin: 14.2 g/dL (ref 11.1–15.9)
Immature Grans (Abs): 0 10*3/uL (ref 0.0–0.1)
Immature Granulocytes: 0 %
LYMPHS ABS: 1.7 10*3/uL (ref 0.7–3.1)
Lymphs: 32 %
MCH: 30.5 pg (ref 26.6–33.0)
MCHC: 34.1 g/dL (ref 31.5–35.7)
MCV: 90 fL (ref 79–97)
MONOS ABS: 0.5 10*3/uL (ref 0.1–0.9)
Monocytes: 9 %
Neutrophils Absolute: 2.9 10*3/uL (ref 1.4–7.0)
Neutrophils: 55 %
PLATELETS: 276 10*3/uL (ref 150–379)
RBC: 4.66 x10E6/uL (ref 3.77–5.28)
RDW: 13.4 % (ref 12.3–15.4)
WBC: 5.2 10*3/uL (ref 3.4–10.8)

## 2015-11-05 LAB — VITAMIN D 25 HYDROXY (VIT D DEFICIENCY, FRACTURES): Vit D, 25-Hydroxy: 39.6 ng/mL (ref 30.0–100.0)

## 2015-11-05 LAB — HIV ANTIBODY (ROUTINE TESTING W REFLEX): HIV SCREEN 4TH GENERATION: NONREACTIVE

## 2015-11-05 LAB — LIPID PANEL W/O CHOL/HDL RATIO
CHOLESTEROL TOTAL: 195 mg/dL (ref 100–199)
HDL: 65 mg/dL (ref >39)
LDL Calculated: 110 mg/dL — ABNORMAL HIGH (ref 0–99)
TRIGLYCERIDES: 102 mg/dL (ref 0–149)
VLDL Cholesterol Cal: 20 mg/dL (ref 5–40)

## 2015-11-05 LAB — TSH: TSH: 2.17 u[IU]/mL (ref 0.450–4.500)

## 2015-11-05 LAB — HEPATITIS C ANTIBODY

## 2015-11-06 ENCOUNTER — Telehealth: Payer: Self-pay | Admitting: Gastroenterology

## 2015-11-06 NOTE — Telephone Encounter (Signed)
Patient returning your call regarding a colonoscopy

## 2015-11-10 NOTE — Telephone Encounter (Signed)
Patient returned your call regarding a colonoscopy °

## 2015-11-21 ENCOUNTER — Telehealth: Payer: Self-pay | Admitting: Gastroenterology

## 2015-11-21 NOTE — Telephone Encounter (Signed)
Please call patient to schedule colonoscopy. She has called several times.

## 2015-11-25 ENCOUNTER — Other Ambulatory Visit: Payer: Self-pay

## 2015-11-25 NOTE — Telephone Encounter (Signed)
Pt scheduled for colonoscopy at [facility] on 12/22/15. Hx of colon polyps Z86.010. Please precert.

## 2015-11-25 NOTE — Telephone Encounter (Signed)
LVM for pt to return my call.

## 2015-11-25 NOTE — Telephone Encounter (Signed)
Gastroenterology Pre-Procedure Review  Request Date: 12/22/15 Requesting Physician: Kathrine Haddock, NP  PATIENT REVIEW QUESTIONS: The patient responded to the following health history questions as indicated:    1. Are you having any GI issues? no 2. Do you have a personal history of Polyps? yes (Hx of polyps ) 3. Do you have a family history of Colon Cancer or Polyps? yes (Father, colon cancer) 4. Diabetes Mellitus? no 5. Joint replacements in the past 12 months?no 6. Major health problems in the past 3 months?no 7. Any artificial heart valves, MVP, or defibrillator?no    MEDICATIONS & ALLERGIES:    Patient reports the following regarding taking any anticoagulation/antiplatelet therapy:   Plavix, Coumadin, Eliquis, Xarelto, Lovenox, Pradaxa, Brilinta, or Effient? no Aspirin? no  Patient confirms/reports the following medications:  Current Outpatient Prescriptions  Medication Sig Dispense Refill  . albuterol (PROVENTIL HFA;VENTOLIN HFA) 108 (90 Base) MCG/ACT inhaler Inhale 2 puffs into the lungs every 6 (six) hours as needed for wheezing or shortness of breath. 1 Inhaler 0  . cyclobenzaprine (FLEXERIL) 10 MG tablet Take 1 tablet (10 mg total) by mouth at bedtime. 30 tablet 0  . omeprazole (PRILOSEC) 20 MG capsule Take 20 mg by mouth as needed.     . traMADol (ULTRAM) 50 MG tablet Take 1 tablet (50 mg total) by mouth every 6 (six) hours as needed. 30 tablet 0   No current facility-administered medications for this visit.    Patient confirms/reports the following allergies:  Allergies  Allergen Reactions  . Tylenol [Acetaminophen]   . Ciprofloxacin Swelling and Rash    No orders of the defined types were placed in this encounter.    AUTHORIZATION INFORMATION Primary Insurance: 1D#: Group #:  Secondary Insurance: 1D#: Group #:  SCHEDULE INFORMATION: Date: 12/22/15 Time: Location: [place]

## 2015-11-26 NOTE — Telephone Encounter (Signed)
Pt scheduled for screening colonoscopy at [facility] on 12/22/15. Please precert.

## 2015-12-04 NOTE — Telephone Encounter (Signed)
Approved with [employer]--AUTH# [id number] for CPT: [phone]

## 2015-12-18 NOTE — Discharge Instructions (Signed)

## 2015-12-22 ENCOUNTER — Ambulatory Visit: Payer: 59 | Admitting: Anesthesiology

## 2015-12-22 ENCOUNTER — Ambulatory Visit
Admission: RE | Admit: 2015-12-22 | Discharge: 2015-12-22 | Disposition: A | Discharge status: Home / Self Care | Payer: 59 | Source: Ambulatory Visit | Attending: Gastroenterology | Admitting: Gastroenterology

## 2015-12-22 ENCOUNTER — Encounter
Admission: RE | Disposition: A | Discharge status: Home / Self Care | Payer: Self-pay | Source: Ambulatory Visit | Attending: Gastroenterology

## 2015-12-22 DIAGNOSIS — K640 First degree hemorrhoids: Secondary | ICD-10-CM | POA: Diagnosis not present

## 2015-12-22 DIAGNOSIS — Z881 Allergy status to other antibiotic agents status: Secondary | ICD-10-CM | POA: Diagnosis not present

## 2015-12-22 DIAGNOSIS — Z8611 Personal history of tuberculosis: Secondary | ICD-10-CM | POA: Diagnosis not present

## 2015-12-22 DIAGNOSIS — Z800 Family history of malignant neoplasm of digestive organs: Secondary | ICD-10-CM | POA: Diagnosis not present

## 2015-12-22 DIAGNOSIS — Z8249 Family history of ischemic heart disease and other diseases of the circulatory system: Secondary | ICD-10-CM | POA: Insufficient documentation

## 2015-12-22 DIAGNOSIS — K5909 Other constipation: Secondary | ICD-10-CM | POA: Diagnosis not present

## 2015-12-22 DIAGNOSIS — J449 Chronic obstructive pulmonary disease, unspecified: Secondary | ICD-10-CM | POA: Insufficient documentation

## 2015-12-22 DIAGNOSIS — Z79899 Other long term (current) drug therapy: Secondary | ICD-10-CM | POA: Insufficient documentation

## 2015-12-22 DIAGNOSIS — Z1211 Encounter for screening for malignant neoplasm of colon: Secondary | ICD-10-CM | POA: Insufficient documentation

## 2015-12-22 DIAGNOSIS — K219 Gastro-esophageal reflux disease without esophagitis: Secondary | ICD-10-CM | POA: Insufficient documentation

## 2015-12-22 DIAGNOSIS — M8580 Other specified disorders of bone density and structure, unspecified site: Secondary | ICD-10-CM | POA: Diagnosis not present

## 2015-12-22 DIAGNOSIS — Z860100 Personal history of colon polyps, unspecified: Secondary | ICD-10-CM | POA: Insufficient documentation

## 2015-12-22 DIAGNOSIS — Z820 Family history of epilepsy and other diseases of the nervous system: Secondary | ICD-10-CM | POA: Diagnosis not present

## 2015-12-22 DIAGNOSIS — Z87442 Personal history of urinary calculi: Secondary | ICD-10-CM | POA: Insufficient documentation

## 2015-12-22 DIAGNOSIS — Z886 Allergy status to analgesic agent status: Secondary | ICD-10-CM | POA: Diagnosis not present

## 2015-12-22 DIAGNOSIS — D123 Benign neoplasm of transverse colon: Secondary | ICD-10-CM | POA: Diagnosis not present

## 2015-12-22 HISTORY — PX: POLYPECTOMY: SHX5525

## 2015-12-22 HISTORY — PX: COLONOSCOPY WITH PROPOFOL: SHX5780

## 2015-12-22 HISTORY — DX: Headache: R51

## 2015-12-22 HISTORY — DX: Headache, unspecified: R51.9

## 2015-12-22 HISTORY — DX: Respiratory tuberculosis unspecified: A15.9

## 2015-12-22 SURGERY — COLONOSCOPY WITH PROPOFOL
Anesthesia: Monitor Anesthesia Care | Wound class: Contaminated

## 2015-12-22 MED ORDER — PROPOFOL 10 MG/ML IV BOLUS
INTRAVENOUS | Status: DC | PRN
  Administered 2015-12-22 (×2): 30 mg via INTRAVENOUS
  Administered 2015-12-22: 20 mg via INTRAVENOUS
  Administered 2015-12-22 (×2): 30 mg via INTRAVENOUS
  Administered 2015-12-22: 100 mg via INTRAVENOUS
  Administered 2015-12-22: 20 mg via INTRAVENOUS

## 2015-12-22 MED ORDER — LACTATED RINGERS IV SOLN
INTRAVENOUS | Status: DC
  Administered 2015-12-22: 09:00:00 via INTRAVENOUS

## 2015-12-22 MED ORDER — STERILE WATER FOR IRRIGATION IR SOLN
Status: DC | PRN
  Administered 2015-12-22: 10:00:00

## 2015-12-22 MED ORDER — LIDOCAINE HCL (CARDIAC) 20 MG/ML IV SOLN
INTRAVENOUS | Status: DC | PRN
  Administered 2015-12-22: 50 mg via INTRAVENOUS

## 2015-12-22 SURGICAL SUPPLY — 22 items
CANISTER SUCT 1200ML W/VALVE (MISCELLANEOUS) ×3 IMPLANT
CLIP HMST 235XBRD CATH ROT (MISCELLANEOUS) IMPLANT
CLIP RESOLUTION 360 11X235 (MISCELLANEOUS)
FCP ESCP3.2XJMB 240X2.8X (MISCELLANEOUS)
FORCEPS BIOP RAD 4 LRG CAP 4 (CUTTING FORCEPS) IMPLANT
FORCEPS BIOP RJ4 240 W/NDL (MISCELLANEOUS)
FORCEPS ESCP3.2XJMB 240X2.8X (MISCELLANEOUS) IMPLANT
GOWN CVR UNV OPN BCK APRN NK (MISCELLANEOUS) ×4 IMPLANT
GOWN ISOL THUMB LOOP REG UNIV (MISCELLANEOUS) ×2
INJECTOR VARIJECT VIN23 (MISCELLANEOUS) IMPLANT
KIT DEFENDO VALVE AND CONN (KITS) IMPLANT
KIT ENDO PROCEDURE OLY (KITS) ×3 IMPLANT
MARKER SPOT ENDO TATTOO 5ML (MISCELLANEOUS) IMPLANT
PAD GROUND ADULT SPLIT (MISCELLANEOUS) IMPLANT
PROBE APC STR FIRE (PROBE) IMPLANT
SNARE SHORT THROW 13M SML OVAL (MISCELLANEOUS) ×3 IMPLANT
SNARE SHORT THROW 30M LRG OVAL (MISCELLANEOUS) IMPLANT
SNARE SNG USE RND 15MM (INSTRUMENTS) IMPLANT
SPOT EX ENDOSCOPIC TATTOO (MISCELLANEOUS)
TRAP ETRAP POLY (MISCELLANEOUS) ×3 IMPLANT
VARIJECT INJECTOR VIN23 (MISCELLANEOUS)
WATER STERILE IRR 250ML POUR (IV SOLUTION) ×3 IMPLANT

## 2015-12-22 NOTE — Anesthesia Preprocedure Evaluation (Signed)
Anesthesia Evaluation  Patient identified by MRN, date of birth, ID band Patient awake    Airway Mallampati: II  TM Distance: >3 FB Neck ROM: Full    Dental   Pulmonary COPD,  COPD inhaler,  Remote Hx of TB - treated and no residual symptoms   Pulmonary exam normal        Cardiovascular Normal cardiovascular exam     Neuro/Psych    GI/Hepatic GERD  Medicated and Controlled,  Endo/Other    Renal/GU      Musculoskeletal   Abdominal   Peds  Hematology   Anesthesia Other Findings   Reproductive/Obstetrics                             Anesthesia Physical Anesthesia Plan  ASA: II  Anesthesia Plan: MAC   Post-op Pain Management:    Induction: Intravenous  Airway Management Planned:   Additional Equipment:   Intra-op Plan:   Post-operative Plan:   Informed Consent: I have reviewed the patients History and Physical, chart, labs and discussed the procedure including the risks, benefits and alternatives for the proposed anesthesia with the patient or authorized representative who has indicated his/her understanding and acceptance.     Plan Discussed with: CRNA  Anesthesia Plan Comments:         Anesthesia Quick Evaluation

## 2015-12-22 NOTE — Transfer of Care (Signed)
Immediate Anesthesia Transfer of Care Note  Patient: Wendy Ashley  Procedure(s) Performed: Procedure(s): COLONOSCOPY WITH PROPOFOL (N/A) POLYPECTOMY  Patient Location: PACU  Anesthesia Type: MAC  Level of Consciousness: awake, alert  and patient cooperative  Airway and Oxygen Therapy: Patient Spontanous Breathing and Patient connected to supplemental oxygen  Post-op Assessment: Post-op Vital signs reviewed, Patient's Cardiovascular Status Stable, Respiratory Function Stable, Patent Airway and No signs of Nausea or vomiting  Post-op Vital Signs: Reviewed and stable  Complications: No apparent anesthesia complications

## 2015-12-22 NOTE — Anesthesia Postprocedure Evaluation (Signed)
Anesthesia Post Note  Patient: Wendy Ashley  Procedure(s) Performed: Procedure(s) (LRB): COLONOSCOPY WITH PROPOFOL (N/A) POLYPECTOMY  Patient location during evaluation: PACU Anesthesia Type: General Level of consciousness: awake and alert Pain management: pain level controlled Vital Signs Assessment: post-procedure vital signs reviewed and stable Respiratory status: spontaneous breathing, nonlabored ventilation, respiratory function stable and patient connected to nasal cannula oxygen Cardiovascular status: blood pressure returned to baseline and stable Postop Assessment: no signs of nausea or vomiting Anesthetic complications: no    Marshell Levan

## 2015-12-22 NOTE — Anesthesia Procedure Notes (Signed)
Procedure Name: MAC Date/Time: 12/22/2015 9:46 AM Performed by: Cameron Ali Pre-anesthesia Checklist: Patient identified, Emergency Drugs available, Suction available, Timeout performed and Patient being monitored Patient Re-evaluated:Patient Re-evaluated prior to inductionOxygen Delivery Method: Nasal cannula Placement Confirmation: positive ETCO2

## 2015-12-22 NOTE — Op Note (Signed)
[facility] Gastroenterology Patient Name: Wendy Ashley Procedure Date: 12/22/2015 9:36 AM MRN: [id number] Account #: [id number] Date of Birth: 04-05-1956 Admit Type: Outpatient Age: 60 Room: [facility] OR ROOM 01 Gender: Female Note Status: Finalized Procedure:            Colonoscopy Indications:          High risk colon cancer surveillance: Personal history                        of colonic polyps Providers:            [name], MD Referring MD:         Kathrine Haddock, PA (Referring MD) Medicines:            Propofol per Anesthesia Complications:        No immediate complications. Procedure:            Pre-Anesthesia Assessment:                       - Prior to the procedure, a History and Physical was                        performed, and patient medications and allergies were                        reviewed. The patient's tolerance of previous                        anesthesia was also reviewed. The risks and benefits of                        the procedure and the sedation options and risks were                        discussed with the patient. All questions were                        answered, and informed consent was obtained. Prior                        Anticoagulants: The patient has taken no previous                        anticoagulant or antiplatelet agents. ASA Grade                        Assessment: II - A patient with mild systemic disease.                        After reviewing the risks and benefits, the patient was                        deemed in satisfactory condition to undergo the                        procedure.                       After obtaining informed consent, the colonoscope was  passed under direct vision. Throughout the procedure,                        the patient's blood pressure, pulse, and oxygen                        saturations were monitored continuously. The Olympus CF                        H180AL  colonoscope (S#: [id number]) was introduced through                        the anus and advanced to the the cecum, identified by                        appendiceal orifice and ileocecal valve. The                        colonoscopy was performed without difficulty. The                        patient tolerated the procedure well. The quality of                        the bowel preparation was poor. Findings:      The perianal and digital rectal examinations were normal.      Two sessile polyps were found in the transverse colon. The polyps were 3       to 8 mm in size. These polyps were removed with a cold snare. Resection       and retrieval were complete.      Non-bleeding internal hemorrhoids were found during retroflexion. The       hemorrhoids were Grade I (internal hemorrhoids that do not prolapse). Impression:           - Preparation of the colon was poor.                       - Two 3 to 8 mm polyps in the transverse colon, removed                        with a cold snare. Resected and retrieved.                       - Non-bleeding internal hemorrhoids. Recommendation:       - Await pathology results.                       - Repeat colonoscopy in 3 years for surveillance. Procedure Code(s):    --- Professional ---                       [phone], Colonoscopy, flexible; with removal of tumor(s),                        polyp(s), or other lesion(s) by snare technique Diagnosis Code(s):    --- Professional ---                       Z86.010, Personal history of colonic polyps  D12.3, Benign neoplasm of transverse colon (hepatic                        flexure or splenic flexure) CPT copyright 2016 American Medical Association. All rights reserved. The codes documented in this report are preliminary and upon coder review may  be revised to meet current compliance requirements. [name], MD 12/22/2015 10:04:36 AM This report has been signed electronically. Number of  Addenda: 0 Note Initiated On: 12/22/2015 9:36 AM Scope Withdrawal Time: 0 hours 9 minutes 26 seconds  Total Procedure Duration: 0 hours 12 minutes 48 seconds       [facility]
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
## 2015-12-23 ENCOUNTER — Encounter: Payer: Self-pay | Admitting: Gastroenterology

## 2015-12-29 ENCOUNTER — Encounter: Payer: Self-pay | Admitting: Gastroenterology

## 2017-02-04 ENCOUNTER — Telehealth: Payer: Self-pay | Admitting: Unknown Physician Specialty

## 2017-02-04 NOTE — Telephone Encounter (Signed)
Needs f/u for lung nodule.  Will need a CT.  Pt will call back and let us know if we can schedule

## 2017-02-09 ENCOUNTER — Other Ambulatory Visit: Payer: Self-pay

## 2017-02-09 NOTE — Telephone Encounter (Signed)
Opened in error

## 2017-02-16 ENCOUNTER — Encounter: Payer: Self-pay | Admitting: Unknown Physician Specialty

## 2017-02-16 ENCOUNTER — Ambulatory Visit
Admission: RE | Admit: 2017-02-16 | Discharge: 2017-02-16 | Disposition: A | Discharge status: Home / Self Care | Payer: BLUE CROSS/BLUE SHIELD | Source: Ambulatory Visit | Attending: Unknown Physician Specialty | Admitting: Unknown Physician Specialty

## 2017-02-16 ENCOUNTER — Ambulatory Visit (INDEPENDENT_AMBULATORY_CARE_PROVIDER_SITE_OTHER): Payer: BLUE CROSS/BLUE SHIELD | Admitting: Unknown Physician Specialty

## 2017-02-16 VITALS — BP 133/86 | HR 71 | Temp 98.3°F | Ht 62.0 in | Wt 117.4 lb

## 2017-02-16 DIAGNOSIS — R911 Solitary pulmonary nodule: Secondary | ICD-10-CM | POA: Diagnosis not present

## 2017-02-16 DIAGNOSIS — M25552 Pain in left hip: Secondary | ICD-10-CM | POA: Diagnosis not present

## 2017-02-16 DIAGNOSIS — G8929 Other chronic pain: Secondary | ICD-10-CM | POA: Insufficient documentation

## 2017-02-16 DIAGNOSIS — Z0000 Encounter for general adult medical examination without abnormal findings: Secondary | ICD-10-CM | POA: Diagnosis not present

## 2017-02-16 DIAGNOSIS — IMO0001 Reserved for inherently not codable concepts without codable children: Secondary | ICD-10-CM

## 2017-02-16 NOTE — Assessment & Plan Note (Signed)
Noted as a serendipitous finding on abdominal CT 12/16.  Pt with COPD and granulomatous changes on chest x-ray.  Order f/u per radiology recommendations
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
## 2017-02-16 NOTE — Assessment & Plan Note (Signed)
Will get x-ray.  Suspect OA

## 2017-02-16 NOTE — Patient Instructions (Addendum)
Please do call to schedule your mammogram; the number to schedule one at either [facility] or [facility] is [phone]   Preventive Care 40-64 Years, Female Preventive care refers to lifestyle choices and visits with your health care provider that can promote health and wellness. What does preventive care include?  A yearly physical exam. This is also called an annual well check.  Dental exams once or twice a year.  Routine eye exams. Ask your health care provider how often you should have your eyes checked.  Personal lifestyle choices, including: ? Daily care of your teeth and gums. ? Regular physical activity. ? Eating a healthy diet. ? Avoiding tobacco and drug use. ? Limiting alcohol use. ? Practicing safe sex. ? Taking low-dose aspirin daily starting at age 50. ? Taking vitamin and mineral supplements as recommended by your health care provider. What happens during an annual well check? The services and screenings done by your health care provider during your annual well check will depend on your age, overall health, lifestyle risk factors, and family history of disease. Counseling Your health care provider may ask you questions about your:  Alcohol use.  Tobacco use.  Drug use.  Emotional well-being.  Home and relationship well-being.  Sexual activity.  Eating habits.  Work and work environment.  Method of birth control.  Menstrual cycle.  Pregnancy history.  Screening You may have the following tests or measurements:  Height, weight, and BMI.  Blood pressure.  Lipid and cholesterol levels. These may be checked every 5 years, or more frequently if you are over 50 years old.  Skin check.  Lung cancer screening. You may have this screening every year starting at age 55 if you have a 30-pack-year history of smoking and currently smoke or have quit within the past 15 years.  Fecal occult blood test (FOBT) of the stool.  You may have this test every year starting at age 50.  Flexible sigmoidoscopy or colonoscopy. You may have a sigmoidoscopy every 5 years or a colonoscopy every 10 years starting at age 50.  Hepatitis C blood test.  Hepatitis B blood test.  Sexually transmitted disease (STD) testing.  Diabetes screening. This is done by checking your blood sugar (glucose) after you have not eaten for a while (fasting). You may have this done every 1-3 years.  Mammogram. This may be done every 1-2 years. Talk to your health care provider about when you should start having regular mammograms. This may depend on whether you have a family history of breast cancer.  BRCA-related cancer screening. This may be done if you have a family history of breast, ovarian, tubal, or peritoneal cancers.  Pelvic exam and Pap test. This may be done every 3 years starting at age 21. Starting at age 30, this may be done every 5 years if you have a Pap test in combination with an HPV test.  Bone density scan. This is done to screen for osteoporosis. You may have this scan if you are at high risk for osteoporosis.  Discuss your test results, treatment options, and if necessary, the need for more tests with your health care provider. Vaccines Your health care provider may recommend certain vaccines, such as:  Influenza vaccine. This is recommended every year.  Tetanus, diphtheria, and acellular pertussis (Tdap, Td) vaccine. You may need a Td booster every 10 years.  Varicella vaccine. You may need this if you have not been vaccinated.  Zoster vaccine. You may   need this after age 11.  Measles, mumps, and rubella (MMR) vaccine. You may need at least one dose of MMR if you were born in 1957 or later. You may also need a second dose.  Pneumococcal 13-valent conjugate (PCV13) vaccine. You may need this if you have certain conditions and were not previously vaccinated.  Pneumococcal polysaccharide (PPSV23) vaccine. You may need  one or two doses if you smoke cigarettes or if you have certain conditions.  Meningococcal vaccine. You may need this if you have certain conditions.  Hepatitis A vaccine. You may need this if you have certain conditions or if you travel or work in places where you may be exposed to hepatitis A.  Hepatitis B vaccine. You may need this if you have certain conditions or if you travel or work in places where you may be exposed to hepatitis B.  Haemophilus influenzae type b (Hib) vaccine. You may need this if you have certain conditions.  Talk to your health care provider about which screenings and vaccines you need and how often you need them. This information is not intended to replace advice given to you by your health care provider. Make sure you discuss any questions you have with your health care provider. Document Released: 07/25/2015 Document Revised: 03/17/2016 Document Reviewed: 04/29/2015 Elsevier Interactive Patient Education  2017 [employer].

## 2017-02-17 LAB — CBC WITH DIFFERENTIAL/PLATELET
BASOS ABS: 0 10*3/uL (ref 0.0–0.2)
BASOS: 1 %
EOS (ABSOLUTE): 0.1 10*3/uL (ref 0.0–0.4)
Eos: 3 %
HEMATOCRIT: 41.5 % (ref 34.0–46.6)
HEMOGLOBIN: 14.4 g/dL (ref 11.1–15.9)
IMMATURE GRANS (ABS): 0 10*3/uL (ref 0.0–0.1)
Immature Granulocytes: 0 %
LYMPHS ABS: 1.2 10*3/uL (ref 0.7–3.1)
LYMPHS: 34 %
MCH: 31.4 pg (ref 26.6–33.0)
MCHC: 34.7 g/dL (ref 31.5–35.7)
MCV: 91 fL (ref 79–97)
Monocytes Absolute: 0.4 10*3/uL (ref 0.1–0.9)
Monocytes: 10 %
NEUTROS ABS: 1.9 10*3/uL (ref 1.4–7.0)
Neutrophils: 52 %
Platelets: 251 10*3/uL (ref 150–379)
RBC: 4.58 x10E6/uL (ref 3.77–5.28)
RDW: 13.8 % (ref 12.3–15.4)
WBC: 3.6 10*3/uL (ref 3.4–10.8)

## 2017-02-17 LAB — LIPID PANEL W/O CHOL/HDL RATIO
Cholesterol, Total: 192 mg/dL (ref 100–199)
HDL: 57 mg/dL (ref >39)
LDL CALC: 121 mg/dL — AB (ref 0–99)
Triglycerides: 71 mg/dL (ref 0–149)
VLDL CHOLESTEROL CAL: 14 mg/dL (ref 5–40)

## 2017-02-17 LAB — COMPREHENSIVE METABOLIC PANEL
ALT: 12 IU/L (ref 0–32)
AST: 18 IU/L (ref 0–40)
Albumin/Globulin Ratio: 2 (ref 1.2–2.2)
Albumin: 4.5 g/dL (ref 3.6–4.8)
Alkaline Phosphatase: 75 IU/L (ref 39–117)
BILIRUBIN TOTAL: 0.7 mg/dL (ref 0.0–1.2)
BUN / CREAT RATIO: 13 (ref 12–28)
BUN: 11 mg/dL (ref 8–27)
CHLORIDE: 103 mmol/L (ref 96–106)
CO2: 25 mmol/L (ref 20–29)
CREATININE: 0.82 mg/dL (ref 0.57–1.00)
Calcium: 9.9 mg/dL (ref 8.7–10.3)
GFR calc non Af Amer: 78 mL/min/{1.73_m2} (ref >59)
GFR, EST AFRICAN AMERICAN: 90 mL/min/{1.73_m2} (ref >59)
GLOBULIN, TOTAL: 2.3 g/dL (ref 1.5–4.5)
GLUCOSE: 88 mg/dL (ref 65–99)
Potassium: 4.2 mmol/L (ref 3.5–5.2)
SODIUM: 142 mmol/L (ref 134–144)
TOTAL PROTEIN: 6.8 g/dL (ref 6.0–8.5)

## 2017-02-17 LAB — TSH: TSH: 2.24 u[IU]/mL (ref 0.450–4.500)

## 2017-02-17 NOTE — Progress Notes (Signed)
Normal labs.  Pt notified through mychart

## 2017-03-15 ENCOUNTER — Telehealth: Payer: Self-pay | Admitting: Unknown Physician Specialty

## 2017-03-15 NOTE — Telephone Encounter (Signed)
Called and spoke with patient. She states that she wanted to let us know that her CT is scheduled for 03/21/17. Patient also asked about a mammogram. I explained that the order has already been entered for her mammogram and I provided [facility]'s number to the patient.

## 2017-03-18 ENCOUNTER — Telehealth: Payer: Self-pay | Admitting: Unknown Physician Specialty

## 2017-03-18 NOTE — Telephone Encounter (Signed)
Please see message. °

## 2017-03-18 NOTE — Telephone Encounter (Signed)
Patient needing a pre-cert done before her CT scan at the hospital. Pre-certification needed with patient's insurance company.  Please Advise.  Thank you

## 2017-03-18 NOTE — Telephone Encounter (Signed)
Patient's insurance will not cover for CT scan . Patient would like to know what she should do.

## 2017-03-21 ENCOUNTER — Ambulatory Visit: Admission: RE | Admit: 2017-03-21 | Payer: BLUE CROSS/BLUE SHIELD | Source: Ambulatory Visit

## 2017-03-21 NOTE — Telephone Encounter (Signed)
Michael from [facility] called regarding patients approval for CT scan to [employer].   Asked Legrand Como where CT fax suppose to go for pre approval.  Legrand Como asked if request could be faxed in to [employer].  [employer]'s number is [phone].  Please Advise.  Thank you

## 2017-03-23 NOTE — Telephone Encounter (Signed)
Tried to call Legrand Como and bring up request on [employer] Portal. Unable to get in contact with representative, unable to go further on portal to requests.   Home Page states: 2017-03-22: Due to [facility] and the potential risk to [place], we may have limited availability for customer service calls and services. We will address all member concerns as quickly as possible.

## 2017-03-25 ENCOUNTER — Ambulatory Visit
Admission: RE | Admit: 2017-03-25 | Discharge: 2017-03-25 | Disposition: A | Discharge status: Home / Self Care | Payer: BLUE CROSS/BLUE SHIELD | Source: Ambulatory Visit | Attending: Unknown Physician Specialty | Admitting: Unknown Physician Specialty

## 2017-03-25 DIAGNOSIS — Z1231 Encounter for screening mammogram for malignant neoplasm of breast: Secondary | ICD-10-CM | POA: Insufficient documentation

## 2017-03-25 DIAGNOSIS — Z0000 Encounter for general adult medical examination without abnormal findings: Secondary | ICD-10-CM

## 2017-04-05 NOTE — Telephone Encounter (Signed)
Authorized and scheduled. Patient notified.

## 2017-04-05 NOTE — Telephone Encounter (Signed)
Keri,  Will you please follow up on this to be sure this patient has what she needs.  Thanks!

## 2017-04-06 ENCOUNTER — Telehealth: Payer: Self-pay | Admitting: Unknown Physician Specialty

## 2017-04-06 NOTE — Telephone Encounter (Signed)
Hospital called patient in regards to scheduled CT scan.  Patient stated that hospital is too expensive. Patient found a company that is able to do CT scan at a more affordable rate.   Patient stated she will not go to the hospital for CT scan due to it being too expensive  Company patient mentioned is : Facilities manager) which are needing a request for CT scan.  [facility] Phone: [phone]  Please Advise.  Thank you

## 2017-04-07 NOTE — Telephone Encounter (Signed)
FYI. I got patient's CT authorized at [facility] before they called and stated they wanted it elsewhere.  I have to do another pre authorization for the facility at [facility].  I have to call [employer] to get them to cancel the one authorized at [facility] because the order has not expired yet. Because it is the same order and diagnosis they will not cover a second one.   Will call [employer] as soon as I can. FYI. Please send back to me.

## 2017-04-07 NOTE — Telephone Encounter (Signed)
Patient's husband and wife called and they found [facility] in [place], [place] where they would like for CT scan to be done. Patient's husband provided phone number and fax for [place].     Fax: [phone] Phone: [phone] Press number 2.

## 2017-04-08 NOTE — Telephone Encounter (Signed)
Thanks for your hard work on this

## 2017-04-11 ENCOUNTER — Ambulatory Visit: Payer: BLUE CROSS/BLUE SHIELD

## 2017-04-12 NOTE — Telephone Encounter (Signed)
Appointment is Thursday 04/14/2017 at 10:45am (arrive at 10:30am) at [facility] in [place]. Auth #: [id number]  Patient's husband notified.

## 2017-04-14 IMAGING — MG MM DIGITAL SCREENING BILATERAL
4 series · 4 of 4 positions shown · non-contrast
Comparison: None.

CLINICAL DATA: Screening.

EXAM:
DIGITAL SCREENING BILATERAL MAMMOGRAM WITH CAD

[R CC]
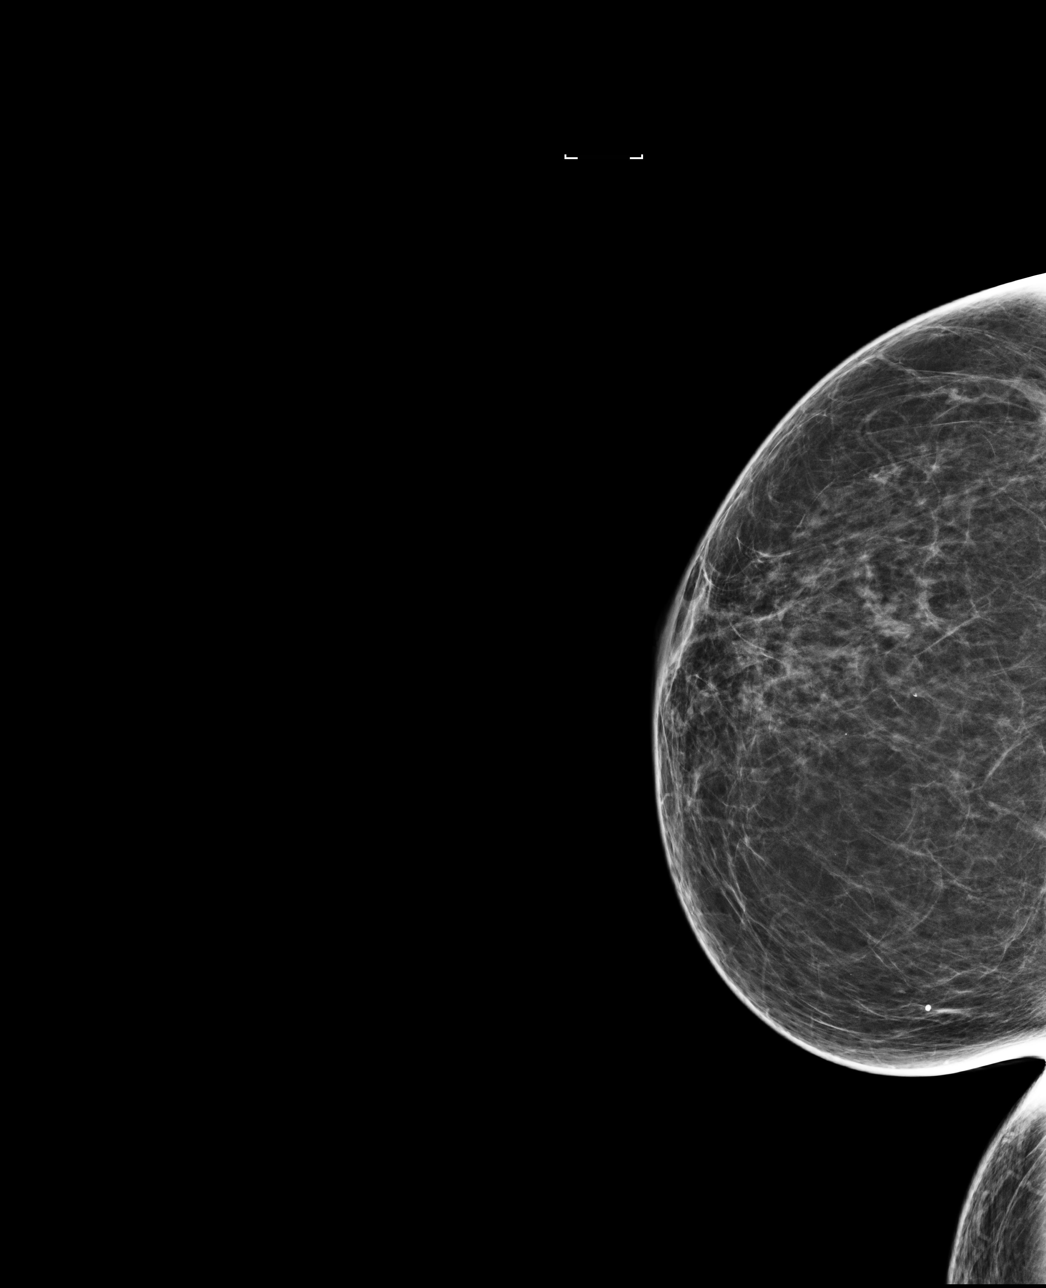

[R MLO]
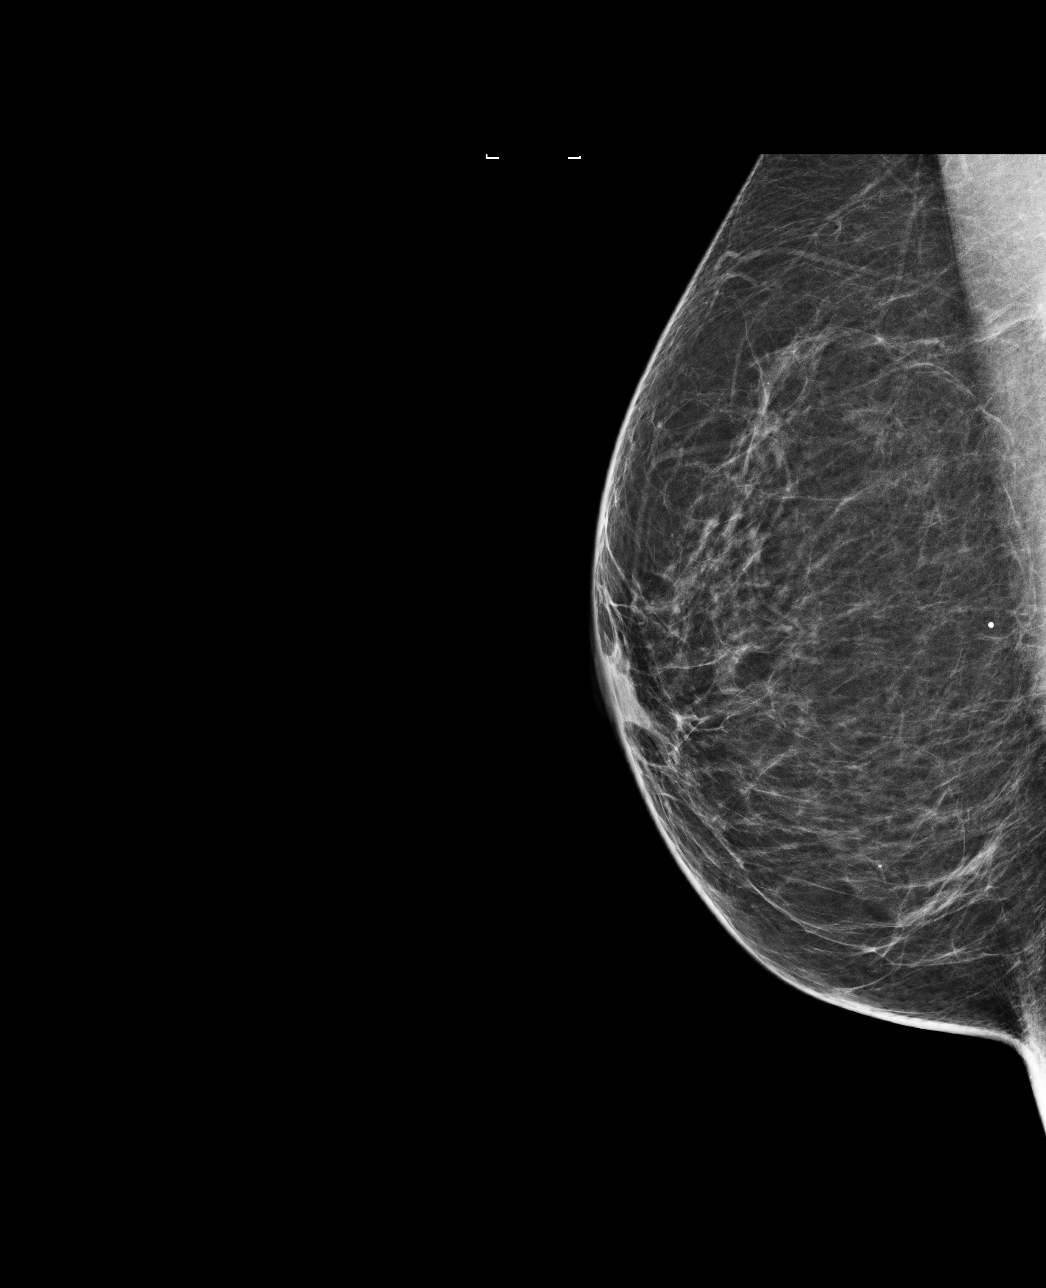

[L CC]
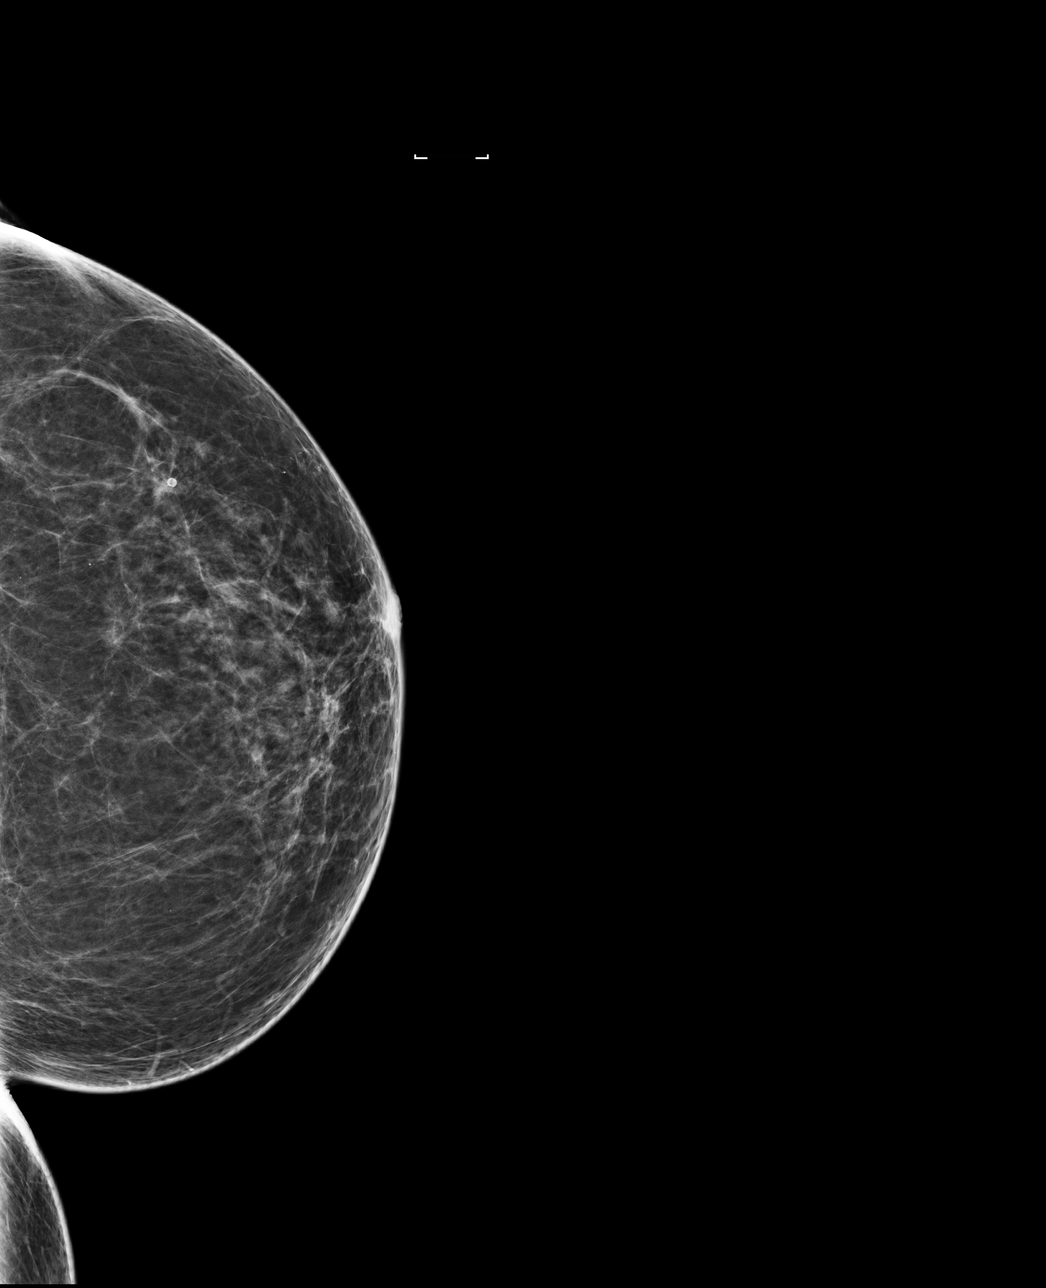

[L MLO]
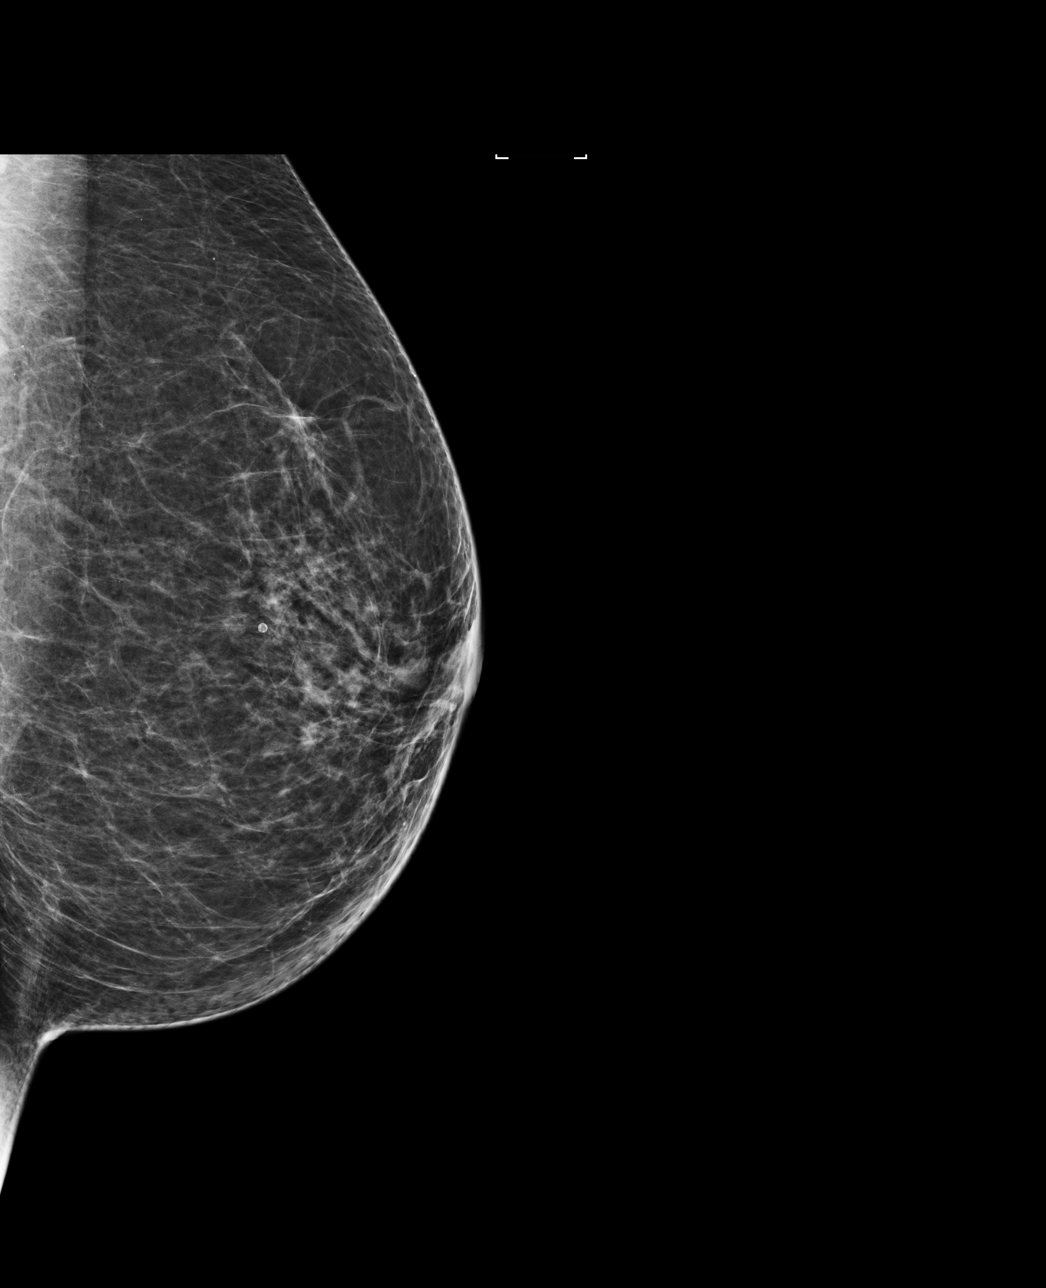

[4 of 4 positions shown; findings below may reference images not displayed]

ACR Breast Density Category b: There are scattered areas of
fibroglandular density.
FINDINGS: There are no findings suspicious for malignancy. Images were
processed with CAD.
IMPRESSION: No mammographic evidence of malignancy. A result letter of this
screening mammogram will be mailed directly to the patient.

RECOMMENDATION:
Screening mammogram in one year. (Code:[id number])

BI-RADS CATEGORY  1: Negative.

## 2017-05-25 ENCOUNTER — Ambulatory Visit: Payer: BLUE CROSS/BLUE SHIELD | Admitting: Unknown Physician Specialty

## 2017-05-25 ENCOUNTER — Telehealth: Payer: Self-pay

## 2017-05-25 ENCOUNTER — Encounter: Payer: Self-pay | Admitting: Unknown Physician Specialty

## 2017-05-25 VITALS — BP 150/99 | HR 78 | Temp 98.3°F | Wt 116.0 lb

## 2017-05-25 DIAGNOSIS — R309 Painful micturition, unspecified: Secondary | ICD-10-CM

## 2017-05-25 DIAGNOSIS — N3000 Acute cystitis without hematuria: Secondary | ICD-10-CM

## 2017-05-25 DIAGNOSIS — I10 Essential (primary) hypertension: Secondary | ICD-10-CM | POA: Diagnosis not present

## 2017-05-25 LAB — MICROSCOPIC EXAMINATION: WBC, UA: >30 /hpf — AB (ref 0–?)

## 2017-05-25 LAB — CBC WITH DIFFERENTIAL/PLATELET
HEMATOCRIT: 42.9 % (ref 34.0–46.6)
HEMOGLOBIN: 15 g/dL (ref 11.1–15.9)
LYMPHS ABS: 1.8 10*3/uL (ref 0.7–3.1)
LYMPHS: 23 %
MCH: 31.9 pg (ref 26.6–33.0)
MCHC: 35 g/dL (ref 31.5–35.7)
MCV: 91 fL (ref 79–97)
MID (ABSOLUTE): 0.8 10*3/uL (ref 0.1–1.6)
MID: 10 %
Neutrophils Absolute: 5.1 10*3/uL (ref 1.4–7.0)
Neutrophils: 67 %
Platelets: 240 10*3/uL (ref 150–379)
RBC: 4.7 x10E6/uL (ref 3.77–5.28)
RDW: 12.4 % (ref 12.3–15.4)
WBC: 7.7 10*3/uL (ref 3.4–10.8)

## 2017-05-25 MED ORDER — SULFAMETHOXAZOLE-TRIMETHOPRIM 800-160 MG PO TABS: 1.0000 | ORAL_TABLET | Freq: Two times a day (BID) | ORAL | 0 refills | Status: DC

## 2017-05-25 MED ORDER — PHENAZOPYRIDINE HCL 200 MG PO TABS: 200.0000 mg | ORAL_TABLET | Freq: Three times a day (TID) | ORAL | 0 refills | Status: DC | PRN

## 2017-05-25 MED ORDER — LISINOPRIL 5 MG PO TABS: 5.0000 mg | ORAL_TABLET | Freq: Every day | ORAL | 3 refills | Status: DC

## 2017-05-25 NOTE — Assessment & Plan Note (Signed)
Will start Lisinopril 5 mg daily.  Last labs in August normal.  Discussed BP med side effects

## 2017-05-25 NOTE — Progress Notes (Signed)
BP (!) 150/99   Pulse 78   Temp 98.3 F (36.8 C) (Oral)   Wt 116 lb (52.6 kg)   LMP  (LMP Unknown)   SpO2 98%   BMI 21.22 kg/m    Subjective:    Patient ID: Wendy Ashley, female    DOB: 09-29-1955, 61 y.o.   MRN: [id number]  HPI: Wendy Ashley is a 61 y.o. female  Chief Complaint  Patient presents with  . Urinary Tract Infection    pt states she was seen at [facility] last week for UTI symptoms, given Amoxicillin 1000 BID for 5 days. Patient states she never really felt better on the antibiotic and is not taking it anymore. States she is still having pressure, pain, and burning with urination. Also having pain in lower back now   . Hypertension    pt states her BP has been elevated for the last 2 to 3 months now    UTI with systemic effects of headache and chills  Urinary Tract Infection   This is a new problem. The current episode started in the past 7 days. The problem occurs every urination. The problem has been gradually worsening. The quality of the pain is described as aching and burning. The pain is severe. There has been no fever. She is sexually active. There is no history of pyelonephritis. Associated symptoms include chills, flank pain, frequency and nausea. Pertinent negatives include no discharge, hematuria, possible pregnancy or vomiting. Treatments tried: Amoxil as above. The treatment provided no relief.   Hypertension Pt states she has been checking her BP at home and it is like it is here or higher.   No problems or lightheadedness No chest pain with exertion or shortness of breath No Edema  Hyperlipidemia Using medications without problems: No Muscle aches  Diet compliance:Exercise:    Relevant past medical, surgical, family and social history reviewed and updated as indicated. Interim medical history since our last visit reviewed. Allergies and medications reviewed and updated.  Review of Systems  Constitutional: Positive for chills.    Gastrointestinal: Positive for nausea. Negative for vomiting.  Genitourinary: Positive for flank pain and frequency. Negative for hematuria.    Per HPI unless specifically indicated above     Objective:    BP (!) 150/99   Pulse 78   Temp 98.3 F (36.8 C) (Oral)   Wt 116 lb (52.6 kg)   LMP  (LMP Unknown)   SpO2 98%   BMI 21.22 kg/m   Wt Readings from Last 3 Encounters:  05/25/17 116 lb (52.6 kg)  02/16/17 117 lb 6.4 oz (53.3 kg)  12/22/15 116 lb (52.6 kg)    Physical Exam  Constitutional: She is oriented to person, place, and time. She appears well-developed and well-nourished. No distress.  HENT:  Head: Normocephalic and atraumatic.  Eyes: Conjunctivae and lids are normal. Right eye exhibits no discharge. Left eye exhibits no discharge. No scleral icterus.  Neck: Normal range of motion. Neck supple. No JVD present. Carotid bruit is not present.  Cardiovascular: Normal rate, regular rhythm and normal heart sounds.  Pulmonary/Chest: Effort normal and breath sounds normal.  Abdominal: Normal appearance. There is no splenomegaly or hepatomegaly. There is tenderness in the left upper quadrant and left lower quadrant.  Musculoskeletal: Normal range of motion.  Neurological: She is alert and oriented to person, place, and time.  Skin: Skin is warm, dry and intact. No rash noted. No pallor.  Psychiatric: She has a normal mood  and affect. Her behavior is normal. Judgment and thought content normal.     Assessment & Plan:   WBC was 7.7.  Urine positve for leukocytes, blood, and bacteria  Problem List Items Addressed This Visit      Unprioritized   Essential hypertension, benign    Will start Lisinopril 5 mg daily.  Last labs in August normal.  Discussed BP med side effects       Other Visit Diagnoses    Pain with urination    -  Primary   Relevant Orders   UA/M w/rflx Culture, Routine   CBC With Differential/Platelet   Acute cystitis without hematuria       Urine  positive, Rx for Bactrim BID.  Fluids.  Recheck in 2 days if continued symptoms       Follow up plan: Return in about 4 weeks (around 06/22/2017).

## 2017-05-25 NOTE — Telephone Encounter (Signed)
Copied from [place] [id number]. Topic: General - Other >> May 25, 2017  2:23 PM Clack, Laban Emperor wrote: Reason for CRM: Pt states that Malachy Mood forgot to call her BP medication in.   [place] (N), [place] [phone] (Phone) [phone] (Fax)   Routing to provider for RX.

## 2017-05-25 NOTE — Telephone Encounter (Signed)
Called, apologized to the patient, and let her know that her RX has been sent in for her.

## 2017-05-27 ENCOUNTER — Encounter: Payer: Self-pay | Admitting: Family Medicine

## 2017-05-27 ENCOUNTER — Ambulatory Visit: Payer: BLUE CROSS/BLUE SHIELD | Admitting: Family Medicine

## 2017-05-27 ENCOUNTER — Ambulatory Visit: Payer: Self-pay | Admitting: *Deleted

## 2017-05-27 VITALS — BP 88/49 | HR 68 | Temp 97.9°F | Wt 118.6 lb

## 2017-05-27 DIAGNOSIS — N390 Urinary tract infection, site not specified: Secondary | ICD-10-CM | POA: Diagnosis not present

## 2017-05-27 LAB — CBC WITH DIFFERENTIAL/PLATELET
HEMOGLOBIN: 13.8 g/dL (ref 11.1–15.9)
Hematocrit: 39.4 % (ref 34.0–46.6)
LYMPHS ABS: 1.7 10*3/uL (ref 0.7–3.1)
LYMPHS: 29 %
MCH: 31.6 pg (ref 26.6–33.0)
MCHC: 35 g/dL (ref 31.5–35.7)
MCV: 90 fL (ref 79–97)
MID (Absolute): 0.6 10*3/uL (ref 0.1–1.6)
MID: 11 %
NEUTROS ABS: 3.5 10*3/uL (ref 1.4–7.0)
NEUTROS PCT: 60 %
PLATELETS: 229 10*3/uL (ref 150–379)
RBC: 4.37 x10E6/uL (ref 3.77–5.28)
RDW: 12.2 % — AB (ref 12.3–15.4)
WBC: 5.8 10*3/uL (ref 3.4–10.8)

## 2017-05-27 MED ORDER — DOXYCYCLINE HYCLATE 100 MG PO TABS: 100.0000 mg | ORAL_TABLET | Freq: Two times a day (BID) | ORAL | 0 refills | Status: DC

## 2017-05-27 NOTE — Telephone Encounter (Signed)
Care advice given to patient with verbal understanding. Reason for Disposition . [1] MODERATE pain (e.g., interferes with normal activities) AND [2] pain comes and goes (cramps) AND [3] present > 24 hours  (Exception: pain with Vomiting or Diarrhea - see that Guideline)  Answer Assessment - Initial Assessment Questions 1. LOCATION: "Where does it hurt?"      Left side of abd 2. RADIATION: "Does the pain shoot anywhere else?" (e.g., chest, back)     no 3. ONSET: "When did the pain begin?" (e.g., minutes, hours or days ago)    Tues night 4. SUDDEN: "Gradual or sudden onset?"     gradual 5. PATTERN "Does the pain come and go, or is it constant?"    - If constant: "Is it getting better, staying the same, or worsening?"      (Note: Constant means the pain never goes away completely; most serious pain is constant and it progresses)     - If intermittent: "How long does it last?" "Do you have pain now?"     (Note: Intermittent means the pain goes away completely between bouts)     Getting worse,constant pain 6. SEVERITY: "How bad is the pain?"  (e.g., Scale 1-10; mild, moderate, or severe)   - MILD (1-3): doesn't interfere with normal activities, abdomen soft and not tender to touch    - MODERATE (4-7): interferes with normal activities or awakens from sleep, tender to touch    - SEVERE (8-10): excruciating pain, doubled over, unable to do any normal activities      6 7. RECURRENT SYMPTOM: "Have you ever had this type of abdominal pain before?" If so, ask: "When was the last time?" and "What happened that time?"      no 8. CAUSE: "What do you think is causing the abdominal pain?"     no 9. RELIEVING/AGGRAVATING FACTORS: "What makes it better or worse?" (e.g., movement, antacids, bowel movement)     nothing 10. OTHER SYMPTOMS: "Has there been any vomiting, diarrhea, constipation, or urine problems?"       Nausea at times 11. PREGNANCY: "Is there any chance you are pregnant?" "When was your  last menstrual period?"       no  Protocols used: ABDOMINAL PAIN - [facility]
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
## 2017-05-29 LAB — UA/M W/RFLX CULTURE, ROUTINE
BILIRUBIN UA: NEGATIVE
GLUCOSE, UA: NEGATIVE
KETONES UA: NEGATIVE
Nitrite, UA: NEGATIVE
SPEC GRAV UA: 1.015 (ref 1.005–1.030)
Urobilinogen, Ur: 1 mg/dL (ref 0.2–1.0)
pH, UA: 7.5 (ref 5.0–7.5)

## 2017-05-29 LAB — URINE CULTURE, REFLEX

## 2017-05-30 ENCOUNTER — Telehealth: Payer: Self-pay | Admitting: Family Medicine

## 2017-05-30 LAB — UA/M W/RFLX CULTURE, ROUTINE
BILIRUBIN UA: NEGATIVE
GLUCOSE, UA: NEGATIVE
KETONES UA: NEGATIVE
Nitrite, UA: POSITIVE — AB
Protein, UA: NEGATIVE
Urobilinogen, Ur: 0.2 mg/dL (ref 0.2–1.0)
pH, UA: 6.5 (ref 5.0–7.5)

## 2017-05-30 LAB — MICROSCOPIC EXAMINATION

## 2017-05-30 LAB — URINE CULTURE, REFLEX

## 2017-05-30 MED ORDER — NITROFURANTOIN MONOHYD MACRO 100 MG PO CAPS: 100.0000 mg | ORAL_CAPSULE | Freq: Two times a day (BID) | ORAL | 0 refills | Status: DC

## 2017-05-30 NOTE — Telephone Encounter (Signed)
Please call and let her know that her first culture came back resistant to the bactrim she was on as well as the doxy she just started on. I've sent over macrobid for her to start on which should work.

## 2017-05-30 NOTE — Patient Instructions (Signed)
Follow up in 5 days if no better

## 2017-05-30 NOTE — Telephone Encounter (Signed)
Patient notified

## 2017-06-08 ENCOUNTER — Encounter: Payer: Self-pay | Admitting: Unknown Physician Specialty

## 2017-06-08 ENCOUNTER — Ambulatory Visit: Payer: BLUE CROSS/BLUE SHIELD | Admitting: Unknown Physician Specialty

## 2017-06-08 VITALS — BP 138/87 | HR 87 | Temp 98.1°F | Wt 117.0 lb

## 2017-06-08 DIAGNOSIS — M545 Low back pain: Secondary | ICD-10-CM

## 2017-06-08 DIAGNOSIS — R300 Dysuria: Secondary | ICD-10-CM

## 2017-06-08 DIAGNOSIS — N390 Urinary tract infection, site not specified: Secondary | ICD-10-CM | POA: Diagnosis not present

## 2017-06-08 DIAGNOSIS — R1010 Upper abdominal pain, unspecified: Secondary | ICD-10-CM | POA: Diagnosis not present

## 2017-06-08 DIAGNOSIS — N3000 Acute cystitis without hematuria: Secondary | ICD-10-CM | POA: Diagnosis not present

## 2017-06-08 DIAGNOSIS — R102 Pelvic and perineal pain: Secondary | ICD-10-CM | POA: Diagnosis not present

## 2017-06-08 LAB — CBC WITH DIFFERENTIAL/PLATELET
HEMATOCRIT: 40.7 % (ref 34.0–46.6)
HEMOGLOBIN: 14.1 g/dL (ref 11.1–15.9)
LYMPHS: 34 %
Lymphocytes Absolute: 1.5 10*3/uL (ref 0.7–3.1)
MCH: 31.7 pg (ref 26.6–33.0)
MCHC: 34.6 g/dL (ref 31.5–35.7)
MCV: 92 fL (ref 79–97)
MID (Absolute): 0.8 10*3/uL (ref 0.1–1.6)
MID: 19 %
NEUTROS PCT: 47 %
Neutrophils Absolute: 2 10*3/uL (ref 1.4–7.0)
Platelets: 425 10*3/uL — ABNORMAL HIGH (ref 150–379)
RBC: 4.45 x10E6/uL (ref 3.77–5.28)
RDW: 12.4 % (ref 12.3–15.4)
WBC: 4.3 10*3/uL (ref 3.4–10.8)

## 2017-06-08 MED ORDER — CEFUROXIME AXETIL 250 MG PO TABS: 250.0000 mg | ORAL_TABLET | Freq: Two times a day (BID) | ORAL | 0 refills | Status: DC

## 2017-06-08 NOTE — Progress Notes (Signed)
BP 138/87 (BP Location: Left Arm, Cuff Size: Small)   Pulse 87   Temp 98.1 F (36.7 C) (Oral)   Wt 117 lb (53.1 kg)   LMP  (LMP Unknown)   SpO2 99%   BMI 21.40 kg/m    Subjective:    Patient ID: Wendy Ashley, female    DOB: 06-19-1956, 61 y.o.   MRN: [id number]  HPI: Wendy Ashley is a 62 y.o. female  Chief Complaint  Patient presents with  . Urinary Tract Infection    pt states she is not any better, states she finished all antibiotics but started having burning with urination and low back pain again    UTI Has had urinary symptoms for quite some time.  Bacteria on culture resistent to Bactrim and Doxycycine, the first 2 antibioitcs tried.  Macrobid taken, better but not 100%.  Still sore and painful.  Having a lot of pelvic and back pain that does not change with position changes.  .  Having chills but not taking temperature.  Last CBC was normal.    Relevant past medical, surgical, family and social history reviewed and updated as indicated. Interim medical history since our last visit reviewed. Allergies and medications reviewed and updated.  Review of Systems  Constitutional: Negative.   HENT: Negative.     Per HPI unless specifically indicated above     Objective:    BP 138/87 (BP Location: Left Arm, Cuff Size: Small)   Pulse 87   Temp 98.1 F (36.7 C) (Oral)   Wt 117 lb (53.1 kg)   LMP  (LMP Unknown)   SpO2 99%   BMI 21.40 kg/m   Wt Readings from Last 3 Encounters:  06/08/17 117 lb (53.1 kg)  05/27/17 118 lb 9.6 oz (53.8 kg)  05/25/17 116 lb (52.6 kg)    Physical Exam  Constitutional: She is oriented to person, place, and time. She appears well-developed and well-nourished. No distress.  HENT:  Head: Normocephalic and atraumatic.  Eyes: Conjunctivae and lids are normal. Right eye exhibits no discharge. Left eye exhibits no discharge. No scleral icterus.  Neck: Normal range of motion. Neck supple. No JVD present. Carotid bruit is not present.    Cardiovascular: Normal rate, regular rhythm and normal heart sounds.  Pulmonary/Chest: Effort normal and breath sounds normal.  Abdominal: Normal appearance. There is no splenomegaly or hepatomegaly. There is tenderness in the right upper quadrant, right lower quadrant, suprapubic area and left upper quadrant.  Genitourinary: Vagina normal. Uterus is tender. Cervix exhibits motion tenderness. Cervix exhibits no discharge and no friability. Right adnexum displays tenderness. Left adnexum displays tenderness. No erythema in the vagina. No vaginal discharge found.  Musculoskeletal: Normal range of motion.  Neurological: She is alert and oriented to person, place, and time.  Skin: Skin is warm, dry and intact. No rash noted. No pallor.  Psychiatric: She has a normal mood and affect. Her behavior is normal. Judgment and thought content normal.    Results for orders placed or performed in visit on 05/27/17  Microscopic Examination  Result Value Ref Range   WBC, UA >30 (A) 0 - 5 /hpf   RBC, UA 0-2 0 - 2 /hpf   Epithelial Cells (non renal) 0-10 0 - 10 /hpf   Renal Epithel, UA 0-10 (A) None seen /hpf   Bacteria, UA Moderate (A) None seen/Few  Urine Culture, Reflex  Result Value Ref Range   Urine Culture, Routine Final report (A)    Organism  ID, Bacteria Escherichia coli (A)    Antimicrobial Susceptibility Comment   CBC With Differential/Platelet  Result Value Ref Range   WBC 5.8 3.4 - 10.8 x10E3/uL   RBC 4.37 3.77 - 5.28 x10E6/uL   Hemoglobin 13.8 11.1 - 15.9 g/dL   Hematocrit 39.4 34.0 - 46.6 %   MCV 90 79 - 97 fL   MCH 31.6 26.6 - 33.0 pg   MCHC 35.0 31.5 - 35.7 g/dL   RDW 12.2 (L) 12.3 - 15.4 %   Platelets 229 150 - 379 x10E3/uL   Neutrophils 60 Not Estab. %   Lymphs 29 Not Estab. %   MID 11 Not Estab. %   Neutrophils Absolute 3.5 1.4 - 7.0 x10E3/uL   Lymphocytes Absolute 1.7 0.7 - 3.1 x10E3/uL   MID (Absolute) 0.6 0.1 - 1.6 X10E3/uL  UA/M w/rflx Culture, Routine  Result Value  Ref Range   Specific Gravity, UA <1.005 (L) 1.005 - 1.030   pH, UA 6.5 5.0 - 7.5   Color, UA Yellow Yellow   Appearance Ur Cloudy (A) Clear   Leukocytes, UA 3+ (A) Negative   Protein, UA Negative Negative/Trace   Glucose, UA Negative Negative   Ketones, UA Negative Negative   RBC, UA 1+ (A) Negative   Bilirubin, UA Negative Negative   Urobilinogen, Ur 0.2 0.2 - 1.0 mg/dL   Nitrite, UA Positive (A) Negative   Microscopic Examination See below:    Urinalysis Reflex Comment       Assessment & Plan:   Problem List Items Addressed This Visit    None    Visit Diagnoses    Burning with urination    -  Primary   Relevant Orders   UA/M w/rflx Culture, Routine   Ambulatory referral to Urology   Low back pain, unspecified back pain laterality, unspecified chronicity, with sciatica presence unspecified       Relevant Orders   UA/M w/rflx Culture, Routine   CBC With Differential/Platelet   Comprehensive metabolic panel   Acute cystitis without hematuria       Relevant Orders   CBC With Differential/Platelet   Comprehensive metabolic panel   Ambulatory referral to Urology   Pain of upper abdomen       Relevant Orders   US Abdomen Complete   Pelvic pain       Relevant Orders   US PELVIC COMPLETE WITH TRANSVAGINAL   Ambulatory referral to Urology   Chronic UTI       Relevant Medications   cefUROXime (CEFTIN) 250 MG tablet      UTI symptoms not improving and getting worse.  Normal CBC with subjective systemic symptoms.  Positive urine.  Pelvic exam tender throughout.  Will get abdominal and pelvic US.  Change antibiotic to  Ceftin.    Refer to Urology.    Follow up plan: Return in about 2 weeks (around 06/22/2017).

## 2017-06-09 LAB — COMPREHENSIVE METABOLIC PANEL
ALBUMIN: 4.3 g/dL (ref 3.6–4.8)
ALK PHOS: 87 IU/L (ref 39–117)
ALT: 17 IU/L (ref 0–32)
AST: 20 IU/L (ref 0–40)
Albumin/Globulin Ratio: 1.8 (ref 1.2–2.2)
BILIRUBIN TOTAL: 0.3 mg/dL (ref 0.0–1.2)
BUN / CREAT RATIO: 14 (ref 12–28)
BUN: 11 mg/dL (ref 8–27)
CHLORIDE: 101 mmol/L (ref 96–106)
CO2: 25 mmol/L (ref 20–29)
Calcium: 9.7 mg/dL (ref 8.7–10.3)
Creatinine, Ser: 0.79 mg/dL (ref 0.57–1.00)
GFR calc Af Amer: 94 mL/min/{1.73_m2} (ref >59)
GFR calc non Af Amer: 82 mL/min/{1.73_m2} (ref >59)
GLOBULIN, TOTAL: 2.4 g/dL (ref 1.5–4.5)
GLUCOSE: 68 mg/dL (ref 65–99)
POTASSIUM: 4.4 mmol/L (ref 3.5–5.2)
SODIUM: 140 mmol/L (ref 134–144)
Total Protein: 6.7 g/dL (ref 6.0–8.5)

## 2017-06-12 LAB — URINE CULTURE, REFLEX

## 2017-06-12 LAB — UA/M W/RFLX CULTURE, ROUTINE
BILIRUBIN UA: NEGATIVE
Glucose, UA: NEGATIVE
KETONES UA: NEGATIVE
Nitrite, UA: NEGATIVE
Protein, UA: NEGATIVE
RBC UA: NEGATIVE
Specific Gravity, UA: 1.015 (ref 1.005–1.030)
Urobilinogen, Ur: 0.2 mg/dL (ref 0.2–1.0)
pH, UA: 8.5 — ABNORMAL HIGH (ref 5.0–7.5)

## 2017-06-12 LAB — MICROSCOPIC EXAMINATION

## 2017-06-14 ENCOUNTER — Ambulatory Visit
Admission: RE | Admit: 2017-06-14 | Discharge: 2017-06-14 | Disposition: A | Discharge status: Home / Self Care | Payer: BLUE CROSS/BLUE SHIELD | Source: Ambulatory Visit | Attending: Unknown Physician Specialty | Admitting: Unknown Physician Specialty

## 2017-06-14 DIAGNOSIS — K838 Other specified diseases of biliary tract: Secondary | ICD-10-CM | POA: Insufficient documentation

## 2017-06-14 DIAGNOSIS — R102 Pelvic and perineal pain: Secondary | ICD-10-CM

## 2017-06-14 DIAGNOSIS — R1010 Upper abdominal pain, unspecified: Secondary | ICD-10-CM | POA: Diagnosis present

## 2017-06-16 ENCOUNTER — Encounter: Payer: Self-pay | Admitting: Urology

## 2017-06-16 ENCOUNTER — Ambulatory Visit: Payer: BLUE CROSS/BLUE SHIELD | Admitting: Urology

## 2017-06-22 ENCOUNTER — Ambulatory Visit: Payer: BLUE CROSS/BLUE SHIELD | Admitting: Unknown Physician Specialty

## 2017-06-22 ENCOUNTER — Encounter: Payer: Self-pay | Admitting: Unknown Physician Specialty

## 2017-06-22 VITALS — BP 144/87 | HR 76 | Temp 97.9°F | Wt 117.4 lb

## 2017-06-22 DIAGNOSIS — I10 Essential (primary) hypertension: Secondary | ICD-10-CM

## 2017-06-22 DIAGNOSIS — T148XXA Other injury of unspecified body region, initial encounter: Secondary | ICD-10-CM | POA: Diagnosis not present

## 2017-06-22 DIAGNOSIS — N390 Urinary tract infection, site not specified: Secondary | ICD-10-CM | POA: Diagnosis not present

## 2017-06-22 MED ORDER — LISINOPRIL 5 MG PO TABS: 5.0000 mg | ORAL_TABLET | Freq: Every day | ORAL | 3 refills | Status: DC

## 2017-06-22 NOTE — Assessment & Plan Note (Signed)
Not to goal.  Restart Lisinopril  5 mg.

## 2017-06-22 NOTE — Assessment & Plan Note (Signed)
History of recurrent UTI's.  Most recently a bacterial resistant UTI.  She is better after being on Ceftin.  OK to cancel Urology appt.

## 2017-06-22 NOTE — Progress Notes (Signed)
BP (!) 144/87   Pulse 76   Temp 97.9 F (36.6 C) (Oral)   Wt 117 lb 6.4 oz (53.3 kg)   LMP  (LMP Unknown)   SpO2 98%   BMI 21.47 kg/m    Subjective:    Patient ID: Wendy Ashley, female    DOB: Oct 17, 1955, 61 y.o.   MRN: [id number]  HPI: TAMBERLYN MIDGLEY is a 61 y.o. female  Chief Complaint  Patient presents with  . Hypertension    pt states she wants to discuss her BP, has a list of home readings   . Bleeding/Bruising    pt states 2 weeks ago she had a very sharp pain on her hand and then a bruise came up. States it happened again on the palm of her hand.    Recurrent UTI Pt is here to f/u of her recurrent UTI.  Resistant to Cipro and Septra.  Macrobid better, but last rx of Ceftin seemed to help.  Pelvic and abdominal US are negative.  No UTI symptoms of frequency or burning and no more pelvic and abdominal pain  Hypertension Stopped Lisinopril while she was ill.  BP has been high at home at 138-148/70-100.  No chest pain or SOB.  Having palpitations.  Often feels heart is beating hard with change in position.    Bruising Pt noted sharp momentary pain in a specific spot on hand.  Once on right hand at base of finger  and once on left heel of hand.  This was followed by bruising.    Relevant past medical, surgical, family and social history reviewed and updated as indicated. Interim medical history since our last visit reviewed. Allergies and medications reviewed and updated.  Review of Systems  Per HPI unless specifically indicated above     Objective:    BP (!) 144/87   Pulse 76   Temp 97.9 F (36.6 C) (Oral)   Wt 117 lb 6.4 oz (53.3 kg)   LMP  (LMP Unknown)   SpO2 98%   BMI 21.47 kg/m   Wt Readings from Last 3 Encounters:  06/22/17 117 lb 6.4 oz (53.3 kg)  06/08/17 117 lb (53.1 kg)  05/27/17 118 lb 9.6 oz (53.8 kg)    Physical Exam  Constitutional: She is oriented to person, place, and time. She appears well-developed and well-nourished. No distress.    HENT:  Head: Normocephalic and atraumatic.  Eyes: Conjunctivae and lids are normal. Right eye exhibits no discharge. Left eye exhibits no discharge. No scleral icterus.  Neck: Normal range of motion. Neck supple. No JVD present. Carotid bruit is not present.  Cardiovascular: Normal rate, regular rhythm and normal heart sounds.  Pulmonary/Chest: Effort normal and breath sounds normal.  Abdominal: Soft. Normal appearance. There is no splenomegaly or hepatomegaly. There is no tenderness. There is no rebound and no guarding.  Musculoskeletal: Normal range of motion.  Neurological: She is alert and oriented to person, place, and time.  Skin: Skin is warm, dry and intact. No rash noted. No pallor.  Psychiatric: She has a normal mood and affect. Her behavior is normal. Judgment and thought content normal.    Results for orders placed or performed in visit on 06/08/17  Microscopic Examination  Result Value Ref Range   WBC, UA 11-30 (A) 0 - 5 /hpf   RBC, UA 0-2 0 - 2 /hpf   Epithelial Cells (non renal) 0-10 0 - 10 /hpf   Bacteria, UA Few None seen/Few  Urine  Culture, Reflex  Result Value Ref Range   Urine Culture, Routine Final report (A)    Organism ID, Bacteria Escherichia coli (A)    Antimicrobial Susceptibility Comment   UA/M w/rflx Culture, Routine  Result Value Ref Range   Specific Gravity, UA 1.015 1.005 - 1.030   pH, UA 8.5 (H) 5.0 - 7.5   Color, UA Yellow Yellow   Appearance Ur Cloudy (A) Clear   Leukocytes, UA 3+ (A) Negative   Protein, UA Negative Negative/Trace   Glucose, UA Negative Negative   Ketones, UA Negative Negative   RBC, UA Negative Negative   Bilirubin, UA Negative Negative   Urobilinogen, Ur 0.2 0.2 - 1.0 mg/dL   Nitrite, UA Negative Negative   Microscopic Examination See below:    Urinalysis Reflex Comment   CBC With Differential/Platelet  Result Value Ref Range   WBC 4.3 3.4 - 10.8 x10E3/uL   RBC 4.45 3.77 - 5.28 x10E6/uL   Hemoglobin 14.1 11.1 - 15.9  g/dL   Hematocrit 40.7 34.0 - 46.6 %   MCV 92 79 - 97 fL   MCH 31.7 26.6 - 33.0 pg   MCHC 34.6 31.5 - 35.7 g/dL   RDW 12.4 12.3 - 15.4 %   Platelets 425 (H) 150 - 379 x10E3/uL   Neutrophils 47 Not Estab. %   Lymphs 34 Not Estab. %   MID 19 Not Estab. %   Neutrophils Absolute 2.0 1.4 - 7.0 x10E3/uL   Lymphocytes Absolute 1.5 0.7 - 3.1 x10E3/uL   MID (Absolute) 0.8 0.1 - 1.6 X10E3/uL  Comprehensive metabolic panel  Result Value Ref Range   Glucose 68 65 - 99 mg/dL   BUN 11 8 - 27 mg/dL   Creatinine, Ser 0.79 0.57 - 1.00 mg/dL   GFR calc non Af Amer 82 >59 mL/min/1.73   GFR calc Af Amer 94 >59 mL/min/1.73   BUN/Creatinine Ratio 14 12 - 28   Sodium 140 134 - 144 mmol/L   Potassium 4.4 3.5 - 5.2 mmol/L   Chloride 101 96 - 106 mmol/L   CO2 25 20 - 29 mmol/L   Calcium 9.7 8.7 - 10.3 mg/dL   Total Protein 6.7 6.0 - 8.5 g/dL   Albumin 4.3 3.6 - 4.8 g/dL   Globulin, Total 2.4 1.5 - 4.5 g/dL   Albumin/Globulin Ratio 1.8 1.2 - 2.2   Bilirubin Total 0.3 0.0 - 1.2 mg/dL   Alkaline Phosphatase 87 39 - 117 IU/L   AST 20 0 - 40 IU/L   ALT 17 0 - 32 IU/L      Assessment & Plan:   Problem List Items Addressed This Visit      Unprioritized   Essential hypertension, benign    Not to goal.  Restart Lisinopril  5 mg.        Relevant Medications   lisinopril (PRINIVIL,ZESTRIL) 5 MG tablet   UTI (urinary tract infection)    History of recurrent UTI's.  Most recently a bacterial resistant UTI.  She is better after being on Ceftin.  OK to cancel Urology appt.         Other Visit Diagnoses    Bruising    -  Primary   Not sure of source of pain and bruising.  Platlet count and liver enzymes are normal. Will continue to follow trajectory and additional work-up if needed.         Follow up plan: Return in about 4 weeks (around 07/20/2017).

## 2017-07-20 ENCOUNTER — Ambulatory Visit: Payer: BLUE CROSS/BLUE SHIELD | Admitting: Unknown Physician Specialty

## 2017-07-20 ENCOUNTER — Encounter: Payer: Self-pay | Admitting: Unknown Physician Specialty

## 2017-07-20 VITALS — BP 107/71 | HR 80 | Temp 98.3°F | Wt 117.2 lb

## 2017-07-20 DIAGNOSIS — I10 Essential (primary) hypertension: Secondary | ICD-10-CM | POA: Diagnosis not present

## 2017-07-20 DIAGNOSIS — Z87440 Personal history of urinary (tract) infections: Secondary | ICD-10-CM

## 2017-07-20 NOTE — Assessment & Plan Note (Signed)
Stable, continue present medications.   

## 2017-07-20 NOTE — Progress Notes (Signed)
BP 107/71   Pulse 80   Temp 98.3 F (36.8 C) (Oral)   Wt 117 lb 3.2 oz (53.2 kg)   LMP  (LMP Unknown)   SpO2 100%   BMI 21.44 kg/m    Subjective:    Patient ID: Wendy Ashley, female    DOB: 1955/07/30, 62 y.o.   MRN: [id number]  HPI: DELENN AHN is a 62 y.o. female  Chief Complaint  Patient presents with  . Hypertension    4 week f/up   Hypertension Using medications without difficulty Average home BPs usually 140/90 or below  No problems or lightheadedness No chest pain with exertion or shortness of breath No Edema  UTI No symptoms following treatment.  Needs recheck urine  Relevant past medical, surgical, family and social history reviewed and updated as indicated. Interim medical history since our last visit reviewed. Allergies and medications reviewed and updated.  Review of Systems  Per HPI unless specifically indicated above     Objective:    BP 107/71   Pulse 80   Temp 98.3 F (36.8 C) (Oral)   Wt 117 lb 3.2 oz (53.2 kg)   LMP  (LMP Unknown)   SpO2 100%   BMI 21.44 kg/m   Wt Readings from Last 3 Encounters:  07/20/17 117 lb 3.2 oz (53.2 kg)  06/22/17 117 lb 6.4 oz (53.3 kg)  06/08/17 117 lb (53.1 kg)    Physical Exam  Constitutional: She is oriented to person, place, and time. She appears well-developed and well-nourished. No distress.  HENT:  Head: Normocephalic and atraumatic.  Eyes: Conjunctivae and lids are normal. Right eye exhibits no discharge. Left eye exhibits no discharge. No scleral icterus.  Neck: Normal range of motion. Neck supple. No JVD present. Carotid bruit is not present.  Cardiovascular: Normal rate, regular rhythm and normal heart sounds.  Pulmonary/Chest: Effort normal and breath sounds normal.  Abdominal: Normal appearance. There is no splenomegaly or hepatomegaly.  Musculoskeletal: Normal range of motion.  Neurological: She is alert and oriented to person, place, and time.  Skin: Skin is warm, dry and intact. No  rash noted. No pallor.  Psychiatric: She has a normal mood and affect. Her behavior is normal. Judgment and thought content normal.    Results for orders placed or performed in visit on 06/08/17  Microscopic Examination  Result Value Ref Range   WBC, UA 11-30 (A) 0 - 5 /hpf   RBC, UA 0-2 0 - 2 /hpf   Epithelial Cells (non renal) 0-10 0 - 10 /hpf   Bacteria, UA Few None seen/Few  Urine Culture, Reflex  Result Value Ref Range   Urine Culture, Routine Final report (A)    Organism ID, Bacteria Escherichia coli (A)    Antimicrobial Susceptibility Comment   UA/M w/rflx Culture, Routine  Result Value Ref Range   Specific Gravity, UA 1.015 1.005 - 1.030   pH, UA 8.5 (H) 5.0 - 7.5   Color, UA Yellow Yellow   Appearance Ur Cloudy (A) Clear   Leukocytes, UA 3+ (A) Negative   Protein, UA Negative Negative/Trace   Glucose, UA Negative Negative   Ketones, UA Negative Negative   RBC, UA Negative Negative   Bilirubin, UA Negative Negative   Urobilinogen, Ur 0.2 0.2 - 1.0 mg/dL   Nitrite, UA Negative Negative   Microscopic Examination See below:    Urinalysis Reflex Comment   CBC With Differential/Platelet  Result Value Ref Range   WBC 4.3 3.4 - 10.8  x10E3/uL   RBC 4.45 3.77 - 5.28 x10E6/uL   Hemoglobin 14.1 11.1 - 15.9 g/dL   Hematocrit 40.7 34.0 - 46.6 %   MCV 92 79 - 97 fL   MCH 31.7 26.6 - 33.0 pg   MCHC 34.6 31.5 - 35.7 g/dL   RDW 12.4 12.3 - 15.4 %   Platelets 425 (H) 150 - 379 x10E3/uL   Neutrophils 47 Not Estab. %   Lymphs 34 Not Estab. %   MID 19 Not Estab. %   Neutrophils Absolute 2.0 1.4 - 7.0 x10E3/uL   Lymphocytes Absolute 1.5 0.7 - 3.1 x10E3/uL   MID (Absolute) 0.8 0.1 - 1.6 X10E3/uL  Comprehensive metabolic panel  Result Value Ref Range   Glucose 68 65 - 99 mg/dL   BUN 11 8 - 27 mg/dL   Creatinine, Ser 0.79 0.57 - 1.00 mg/dL   GFR calc non Af Amer 82 >59 mL/min/1.73   GFR calc Af Amer 94 >59 mL/min/1.73   BUN/Creatinine Ratio 14 12 - 28   Sodium 140 134 - 144  mmol/L   Potassium 4.4 3.5 - 5.2 mmol/L   Chloride 101 96 - 106 mmol/L   CO2 25 20 - 29 mmol/L   Calcium 9.7 8.7 - 10.3 mg/dL   Total Protein 6.7 6.0 - 8.5 g/dL   Albumin 4.3 3.6 - 4.8 g/dL   Globulin, Total 2.4 1.5 - 4.5 g/dL   Albumin/Globulin Ratio 1.8 1.2 - 2.2   Bilirubin Total 0.3 0.0 - 1.2 mg/dL   Alkaline Phosphatase 87 39 - 117 IU/L   AST 20 0 - 40 IU/L   ALT 17 0 - 32 IU/L      Assessment & Plan:   Problem List Items Addressed This Visit      Unprioritized   Essential hypertension, benign    Stable, continue present medications.         Other Visit Diagnoses    History of UTI    -  Primary   Unine with 1 plus leukocytes.  Send for culture and sensitivity.  No treatement at this time   Relevant Orders   UA/M w/rflx Culture, Routine       Follow up plan: Return in about 6 months (around 01/17/2018).

## 2017-07-22 LAB — UA/M W/RFLX CULTURE, ROUTINE
Bilirubin, UA: NEGATIVE
Glucose, UA: NEGATIVE
Ketones, UA: NEGATIVE
Nitrite, UA: NEGATIVE
PH UA: 7.5 (ref 5.0–7.5)
PROTEIN UA: NEGATIVE
RBC UA: NEGATIVE
Specific Gravity, UA: 1.015 (ref 1.005–1.030)
UUROB: 1 mg/dL (ref 0.2–1.0)

## 2017-07-22 LAB — MICROSCOPIC EXAMINATION

## 2017-07-22 LAB — URINE CULTURE, REFLEX: Organism ID, Bacteria: NO GROWTH

## 2017-07-22 NOTE — Progress Notes (Signed)
Normal labs.  Pt notified through mychart

## 2018-01-25 ENCOUNTER — Encounter: Payer: Self-pay | Admitting: Unknown Physician Specialty

## 2018-02-21 ENCOUNTER — Encounter: Payer: BLUE CROSS/BLUE SHIELD | Admitting: Unknown Physician Specialty

## 2018-02-22 ENCOUNTER — Encounter: Payer: BLUE CROSS/BLUE SHIELD | Admitting: Unknown Physician Specialty

## 2018-02-23 ENCOUNTER — Encounter: Payer: BLUE CROSS/BLUE SHIELD | Admitting: Physician Assistant

## 2018-05-09 ENCOUNTER — Ambulatory Visit: Payer: BLUE CROSS/BLUE SHIELD | Admitting: Family Medicine

## 2018-05-09 ENCOUNTER — Encounter: Payer: Self-pay | Admitting: Family Medicine

## 2018-05-09 VITALS — BP 122/74 | HR 73 | Temp 97.5°F | Resp 16 | Ht 62.0 in | Wt 108.9 lb

## 2018-05-09 DIAGNOSIS — Z23 Encounter for immunization: Secondary | ICD-10-CM

## 2018-05-09 DIAGNOSIS — K219 Gastro-esophageal reflux disease without esophagitis: Secondary | ICD-10-CM

## 2018-05-09 DIAGNOSIS — Z13220 Encounter for screening for lipoid disorders: Secondary | ICD-10-CM

## 2018-05-09 DIAGNOSIS — R739 Hyperglycemia, unspecified: Secondary | ICD-10-CM

## 2018-05-09 DIAGNOSIS — M25552 Pain in left hip: Secondary | ICD-10-CM

## 2018-05-09 DIAGNOSIS — I700 Atherosclerosis of aorta: Secondary | ICD-10-CM

## 2018-05-09 DIAGNOSIS — Z8611 Personal history of tuberculosis: Secondary | ICD-10-CM

## 2018-05-09 DIAGNOSIS — Z8679 Personal history of other diseases of the circulatory system: Secondary | ICD-10-CM

## 2018-05-09 DIAGNOSIS — IMO0001 Reserved for inherently not codable concepts without codable children: Secondary | ICD-10-CM

## 2018-05-09 DIAGNOSIS — R911 Solitary pulmonary nodule: Secondary | ICD-10-CM | POA: Diagnosis not present

## 2018-05-09 DIAGNOSIS — G8929 Other chronic pain: Secondary | ICD-10-CM

## 2018-05-09 DIAGNOSIS — Z1231 Encounter for screening mammogram for malignant neoplasm of breast: Secondary | ICD-10-CM

## 2018-05-09 DIAGNOSIS — Z86010 Personal history of colonic polyps: Secondary | ICD-10-CM

## 2018-05-09 DIAGNOSIS — J449 Chronic obstructive pulmonary disease, unspecified: Secondary | ICD-10-CM

## 2018-05-09 MED ORDER — ATORVASTATIN CALCIUM 10 MG PO TABS: 10.0000 mg | ORAL_TABLET | Freq: Every day | ORAL | 3 refills | Status: DC

## 2018-05-09 NOTE — Progress Notes (Signed)
Name: Wendy Ashley   MRN: [id number]    DOB: [birth year]   Date:05/09/2018       Progress Note  Subjective  Chief Complaint  Chief Complaint  Patient presents with  . Establish Care  . Labs Only    fasting  . Orders    colonoscopy & mammogram  . Pulmonary nodule f/u 2016  . Referral    gynecologist  . Immunizations    declines flu shot    HPI    GERD: she states very seldom has symptoms now, maybe once every two weeks has burning sensation when she goes to bed. She states her weight has been stable at home.   History of colon polyps: she is due for repeat in 2020, no blood in stools or change in bowel movements, she has a history of constipation - takes green tea to help with bowel movements  History of COPD and also lung nodule: she has never seen pulmonologist, she is due for repeat CT , it showed calcified granulomas, she has a history of TB, never smoked, no previous history of asthma. She states [place] a farm and they used Furniture conservator/restorer. She denies cough or decrease in exercise tolerance. But has intermittent sob.   Atherosclerosis and mild dyslipidemia: discussed statin therapy but she would like to try   Left hip pain: intermittent now, she states , left outer hip, better with exercise   Patient Active Problem List   Diagnosis Date Noted  . Essential hypertension, benign 05/25/2017  . Hip pain, chronic, left 02/16/2017  . Personal history of colonic polyps   . Benign neoplasm of transverse colon   . Hematuria 11/04/2015  . Lung nodule < 6cm on CT 08/19/2015  . COPD, mild ([place]) 08/19/2015  . Chronic constipation 01/22/2015  . GERD (gastroesophageal reflux disease) 01/22/2015  . Osteopenia 01/22/2015    Past Surgical History:  Procedure Laterality Date  . COLONOSCOPY WITH PROPOFOL N/A 12/22/2015   Procedure: COLONOSCOPY WITH PROPOFOL;  Surgeon: [name], MD;  Location: [place];  Service: Endoscopy;  Laterality: N/A;  . CYSTOSCOPY WITH BIOPSY N/A  09/17/2015   Procedure: CYSTOSCOPY WITH BIOPSY;  Surgeon: Nickie Retort, MD;  Location: [facility];  Service: Urology;  Laterality: N/A;  . CYSTOSCOPY WITH FULGERATION N/A 09/17/2015   Procedure: CYSTOSCOPY WITH FULGERATION;  Surgeon: Nickie Retort, MD;  Location: [facility];  Service: Urology;  Laterality: N/A;  . POLYPECTOMY  12/22/2015   Procedure: POLYPECTOMY;  Surgeon: [name], MD;  Location: [place];  Service: Endoscopy;;  . TUBAL LIGATION      Family History  Problem Relation Age of Onset  . Hypertension Mother   . Alzheimer's disease Father   . Cancer Father        colon  . Hypertension Sister   . Hypertension Brother   . Hypertension Sister   . Breast cancer Neg Hx     Social History   Socioeconomic History  . Marital status: Married    Spouse name: Anne Hahn  . Number of children: 3  . Years of education: Not on file  . Highest education level: Bachelor's degree (e.g., BA, AB, BS)  Occupational History  . Occupation: unemployed  Social Needs  . Financial resource strain: Somewhat hard  . Food insecurity:    Worry: Never true    Inability: Never true  . Transportation needs:    Medical: No    Non-medical: No  Tobacco Use  . Smoking  status: Never Smoker  . Smokeless tobacco: Never Used  Substance and Sexual Activity  . Alcohol use: Yes    Alcohol/week: 2.0 standard drinks    Types: 2 Glasses of wine per week    Comment: on occasion  . Drug use: No  . Sexual activity: Yes    Partners: Male  Lifestyle  . Physical activity:    Days per week: 3 days    Minutes per session: 60 min  . Stress: Not at all  Relationships  . Social connections:    Talks on phone: More than three times a week    Gets together: Once a week    Attends religious service: More than 4 times per year    Active member of club or organization: No    Attends meetings of clubs or organizations: Never    Relationship status: Married  . Intimate partner violence:     Fear of current or ex partner: No    Emotionally abused: No    Physically abused: No    Forced sexual activity: No  Other Topics Concern  . Not on file  Social History Narrative   Originally from [place], moved to [place] with husband from up [place].      Current Outpatient Medications:  Marland Kitchen  Multiple Vitamins-Minerals (MULTIVITAMIN ADULT PO), Take by mouth., Disp: , Rfl:   Allergies  Allergen Reactions  . Tylenol [Acetaminophen]   . Ciprofloxacin Swelling and Rash    LIPS AND THROAT    I personally reviewed active problem list, medication list, allergies, family history, social history with the patient/caregiver today.   ROS  Constitutional: Negative for fever or weight change.  Respiratory: Negative for cough , but she has episodes of  shortness of breath - very seldom .   Cardiovascular: Negative for chest pain or palpitations.  Gastrointestinal: Negative for abdominal pain, no bowel changes.  Musculoskeletal: Negative for gait problem or joint swelling.  Skin: Negative for rash.  Neurological: Negative for dizziness or headache.  No other specific complaints in a complete review of systems (except as listed in HPI above).  Objective  Vitals:   05/09/18 0924  BP: 122/74  Pulse: 73  Resp: 16  Temp: (!) 97.5 F (36.4 C)  TempSrc: Oral  SpO2: 99%  Weight: 108 lb 14.4 oz (49.4 kg)  Height: 5\' 2"  (1.575 m)    Body mass index is 19.92 kg/m.  Physical Exam  Constitutional: Patient appears well-developed and well-nourished.  No distress.  HEENT: head atraumatic, normocephalic, pupils equal and reactive to light,  neck supple, throat within normal limits Cardiovascular: Normal rate, regular rhythm and normal heart sounds.  No murmur heard. No BLE edema. Pulmonary/Chest: Effort normal and breath sounds normal. No respiratory distress. Abdominal: Soft.  There is no tenderness. Psychiatric: Patient has a normal mood and affect. behavior is normal. Judgment and thought  content normal. Muscular skeletal: normal hip exam today , except for mild tenderness over left trochanteric bursa  PHQ2/9: Depression screen [facility] 2/9 05/09/2018 02/16/2017 11/04/2015  Decreased Interest 0 0 0  Down, Depressed, Hopeless 0 0 0  PHQ - 2 Score 0 0 0  Altered sleeping 0 0 -  Tired, decreased energy 0 0 -  Change in appetite 0 0 -  Feeling bad or failure about yourself  0 0 -  Trouble concentrating 0 0 -  Moving slowly or fidgety/restless 0 0 -  Suicidal thoughts 0 0 -  PHQ-9 Score 0 0 -  Difficult doing  work/chores Not difficult at all - -     Fall Risk: Fall Risk  05/09/2018 02/16/2017 11/04/2015  Falls in the past year? No No No    Functional Status Survey: Is the patient deaf or have difficulty hearing?: No Does the patient have difficulty seeing, even when wearing glasses/contacts?: No Does the patient have difficulty concentrating, remembering, or making decisions?: No Does the patient have difficulty walking or climbing stairs?: No Does the patient have difficulty dressing or bathing?: No Does the patient have difficulty doing errands alone such as visiting a doctor's office or shopping?: No    Assessment & Plan  1. Lung nodule < 6cm on CT  - Ambulatory referral to Pulmonology  2. History of hypertension  - CBC with Differential/Platelet - COMPLETE METABOLIC PANEL WITH GFR  3. Hip pain, chronic, left  Discussed foal roller  4. Gastroesophageal reflux disease without esophagitis  On life style modification and prn Tums  5. Atherosclerosis of aorta (HCC)  On CT scan, discussed statin therapy and consider aspirin   6. Lipid screening  - Lipid panel  7. Hyperglycemia  - Hemoglobin A1c  8. COPD, mild ([place])  - Ambulatory referral to Pulmonology - to confirm diagnosis, found on chart,   9. History of tuberculosis  - Ambulatory referral to Pulmonology  12. Needs flu shot  refused

## 2018-05-10 LAB — LIPID PANEL
Cholesterol: 226 mg/dL — ABNORMAL HIGH (ref <200)
HDL: 62 mg/dL (ref >50)
LDL CHOLESTEROL (CALC): 142 mg/dL — AB
Non-HDL Cholesterol (Calc): 164 mg/dL (calc) — ABNORMAL HIGH (ref <130)
TRIGLYCERIDES: 104 mg/dL (ref <150)
Total CHOL/HDL Ratio: 3.6 (calc) (ref <5.0)

## 2018-05-10 LAB — COMPLETE METABOLIC PANEL WITH GFR
AG Ratio: 1.7 (calc) (ref 1.0–2.5)
ALBUMIN MSPROF: 4.6 g/dL (ref 3.6–5.1)
ALT: 17 U/L (ref 6–29)
AST: 19 U/L (ref 10–35)
Alkaline phosphatase (APISO): 75 U/L (ref 33–130)
BUN: 10 mg/dL (ref 7–25)
CALCIUM: 9.8 mg/dL (ref 8.6–10.4)
CHLORIDE: 103 mmol/L (ref 98–110)
CO2: 30 mmol/L (ref 20–32)
Creat: 0.76 mg/dL (ref 0.50–0.99)
GFR, EST AFRICAN AMERICAN: 98 mL/min/{1.73_m2} (ref >=60)
GFR, EST NON AFRICAN AMERICAN: 85 mL/min/{1.73_m2} (ref >=60)
Globulin: 2.7 g/dL (calc) (ref 1.9–3.7)
Glucose, Bld: 92 mg/dL (ref 65–139)
POTASSIUM: 4.2 mmol/L (ref 3.5–5.3)
Sodium: 141 mmol/L (ref 135–146)
TOTAL PROTEIN: 7.3 g/dL (ref 6.1–8.1)
Total Bilirubin: 0.7 mg/dL (ref 0.2–1.2)

## 2018-05-10 LAB — CBC WITH DIFFERENTIAL/PLATELET
BASOS PCT: 1 %
Basophils Absolute: 42 cells/uL (ref 0–200)
EOS PCT: 3.8 %
Eosinophils Absolute: 160 cells/uL (ref 15–500)
HEMATOCRIT: 44.5 % (ref 35.0–45.0)
Hemoglobin: 15.3 g/dL (ref 11.7–15.5)
LYMPHS ABS: 1478 {cells}/uL (ref 850–3900)
MCH: 31 pg (ref 27.0–33.0)
MCHC: 34.4 g/dL (ref 32.0–36.0)
MCV: 90.3 fL (ref 80.0–100.0)
MONOS PCT: 8.8 %
MPV: 11.1 fL (ref 7.5–12.5)
NEUTROS ABS: 2150 {cells}/uL (ref 1500–7800)
Neutrophils Relative %: 51.2 %
Platelets: 252 10*3/uL (ref 140–400)
RBC: 4.93 10*6/uL (ref 3.80–5.10)
RDW: 12.2 % (ref 11.0–15.0)
Total Lymphocyte: 35.2 %
WBC mixed population: 370 cells/uL (ref 200–950)
WBC: 4.2 10*3/uL (ref 3.8–10.8)

## 2018-05-10 LAB — HEMOGLOBIN A1C
Hgb A1c MFr Bld: 5.2 % of total Hgb (ref <5.7)
Mean Plasma Glucose: 103 (calc)
eAG (mmol/L): 5.7 (calc)

## 2018-05-23 ENCOUNTER — Other Ambulatory Visit: Payer: Self-pay

## 2018-05-24 ENCOUNTER — Ambulatory Visit: Payer: BLUE CROSS/BLUE SHIELD | Admitting: Pulmonary Disease

## 2018-05-24 ENCOUNTER — Encounter: Payer: Self-pay | Admitting: Pulmonary Disease

## 2018-05-24 VITALS — BP 130/70 | HR 83 | Ht 62.0 in | Wt 116.0 lb

## 2018-05-24 DIAGNOSIS — K219 Gastro-esophageal reflux disease without esophagitis: Secondary | ICD-10-CM | POA: Diagnosis not present

## 2018-05-24 DIAGNOSIS — R0600 Dyspnea, unspecified: Secondary | ICD-10-CM

## 2018-05-24 DIAGNOSIS — R918 Other nonspecific abnormal finding of lung field: Secondary | ICD-10-CM

## 2018-05-24 DIAGNOSIS — J3089 Other allergic rhinitis: Secondary | ICD-10-CM

## 2018-05-24 DIAGNOSIS — J302 Other seasonal allergic rhinitis: Secondary | ICD-10-CM

## 2018-05-24 MED ORDER — MONTELUKAST SODIUM 10 MG PO TABS: 10.0000 mg | ORAL_TABLET | Freq: Every day | ORAL | 2 refills | Status: DC

## 2018-05-24 NOTE — Patient Instructions (Signed)
1) You will start Singulair (montelukast) one tablet daily. This will help with your nasal congestion in your breathing.  2) You will have a CT scan of the chest repeated. This will be compared to your prior CT chest.  3) YOU DO NOT HAVE COPD!  :-)  4) Try Pepcid AC one tablet at bedtime for reflux.

## 2018-05-24 NOTE — Progress Notes (Signed)
Subjective:    Patient ID: Wendy Ashley, female    DOB: 1955-08-07, 62 y.o.   MRN: [id number]  HPI The patient is a 62 year old lifelong never smoker who presents for evaluation of lung nodules.  She is kindly referred by Kathrine Haddock, NP.  An additional question with regards to this patient is whether she has COPD or not.  The patient had a CT scan of the chest a year ago on 4 October that showed that she had multiple minute lung nodules mostly calcified and consistent with granulomatous disease.  She has not had any imaging since then.  The actual film is not available for review and only available is a report.  This was done in an outside facility.  The patient has had no symptoms related to these nodules.  She does have a history of having had TB 62 years of age she was treated with medications for 8 months.  She has not had any recurrent symptoms of this.  Patient does note that she has at times significant shortness of breath associated with chest tightness.  This does not occur often.  For the most part she has issues with chronic nasal congestion and postnasal drip.  She also has significant issues with gastroesophageal reflux symptoms.  She states that she does not like to use medications and is not on any medications for her reflux or her postnasal drip.  Patient has not noticed any anorexia or weight loss.  She has noted the symptoms for approximately 2 to 3 years.  Though bothersome they have not been incapacitating to her.  She has been told previously that she has "COPD".  She has occasional cough that is for the most part nonproductive.  There is no hemoptysis.  I have reviewed the patient's past medical history, surgical history, family history and it is as noted.  She is a lifelong never smoker.  Used to work as a Marine scientist.  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  She is a native of [place].  She has lived in [place], [place] and [place].  She has been approximately 10 years  in [place] and 33 years in the [place].  She does not have any exotic hobbies.  Most recent travel was to [place].  She has no pets in the home.  No hot tubs.   Review of Systems  Constitutional: Negative.   HENT: Positive for congestion and postnasal drip.   Eyes: Negative.   Respiratory: Positive for cough (Occasional, nonproductive), chest tightness (Occasional) and shortness of breath (Occasional).   Cardiovascular: Negative.   Gastrointestinal:       She has significant heartburn and reflux symptoms.  Otherwise negative.  Endocrine: Negative.   Genitourinary: Negative.   Musculoskeletal: Negative.   Skin: Negative.   Allergic/Immunologic: Negative.   Neurological: Negative.   Hematological: Negative.   Psychiatric/Behavioral: Negative.   All other systems reviewed and are negative.      Objective:   Physical Exam  Constitutional: She is oriented to person, place, and time. She appears well-developed and well-nourished.  HENT:  Head: Normocephalic and atraumatic.  Right Ear: External ear and ear canal normal. A middle ear effusion (Clear) is present.  Left Ear: External ear and ear canal normal. A middle ear effusion (Clear) is present.  Nose: Mucosal edema: Edema, erythema and clear discharge noted.  Mouth/Throat: Uvula is midline.  Clear postnasal drip  Eyes: Pupils are equal, round, and reactive to light.  Conjunctivae and EOM are normal. No scleral icterus.  Neck: Neck supple. No JVD present. No tracheal deviation present. No thyromegaly present.  Cardiovascular: Normal rate, regular rhythm, normal heart sounds and intact distal pulses.  Pulmonary/Chest: Effort normal. No stridor. She has no wheezes. She has no rales. She exhibits no tenderness.  Few rhonchi noted on the upper lung zones.  Abdominal: Soft. She exhibits no distension.  Musculoskeletal: Normal range of motion. She exhibits no edema.  Lymphadenopathy:    She has no cervical adenopathy.    Neurological: She is alert and oriented to person, place, and time.  No focal deficits.  Skin: Skin is warm and dry. No rash noted. No erythema.  Psychiatric: She has a normal mood and affect. Her behavior is normal. Thought content normal.  Nursing note and vitals reviewed.   Spirometry was performed which was normal.  FEV1 was 2.0 L or 88% predicted.  FEV1/FVC was 78%.      Assessment & Plan:   1) Multiple lung nodules/abnormal findings on diagnostic imaging of the lung: The patient has not had imaging in a year.  I suspect that the findings are related to old granulomatous disease after her episode of tuberculosis.  She has been treated for this and has not had any symptoms of recurrence.  We will recheck a CT scan of the chest.  If the findings are stable she is a low risk patient and should not need any further imaging.  2) Dyspnea: The patient has been erroneously labeled as having COPD.  Her problem list has been updated to reflect that this is not an accurate diagnosis.  This patient has never smoked in this entity should not be entertained in the situation.  If anything she likely has mild intermittent to mild persistent asthma.  Her spirometry today was normal however she does have intermittent symptoms that occur occasionally.  Because she is having some issues with perennial allergic rhinitis we will give her a trial of montelukast to see if this helps her symptoms.  States that she has an albuterol inhaler at home.  She could use this as rescue.  She is to let us know if she has increased use of the rescue inhaler.  3) Perennial allergic rhinitis with seasonal variation: She will be given a trial of montelukast.  She was also instructed on nasal hygiene with nasal saline wash.  Avoidance of allergic triggers.  4) Gastroesophageal reflux disease: This issue adds complexity to her management and can likely trigger her issues with asthma.  It was recommended that she exercise  antireflux measures and also trial Pepcid AC over-the-counter.  We will see the patient in 2 months time she is to contact us prior to that time should any new difficulties arise.

## 2018-06-05 ENCOUNTER — Other Ambulatory Visit: Payer: Self-pay | Admitting: Family Medicine

## 2018-06-05 ENCOUNTER — Ambulatory Visit
Admission: RE | Admit: 2018-06-05 | Discharge: 2018-06-05 | Disposition: A | Discharge status: Home / Self Care | Payer: BLUE CROSS/BLUE SHIELD | Source: Ambulatory Visit | Attending: Family Medicine | Admitting: Family Medicine

## 2018-06-05 DIAGNOSIS — Z1231 Encounter for screening mammogram for malignant neoplasm of breast: Secondary | ICD-10-CM

## 2018-06-15 ENCOUNTER — Telehealth: Payer: Self-pay

## 2018-06-15 NOTE — Telephone Encounter (Signed)
Spoke to patient, let her know results of CT done at [facility]. Per Dr. Patsey Berthold, nodules are stable, no change and repeat scan in 1 year. Patient understands with no further questions at this time.

## 2018-11-09 ENCOUNTER — Encounter: Payer: BLUE CROSS/BLUE SHIELD | Admitting: Family Medicine

## 2018-12-27 ENCOUNTER — Telehealth: Payer: Self-pay | Admitting: Gastroenterology

## 2018-12-27 NOTE — Telephone Encounter (Signed)
Pt  Is calling to schedule colonoscopy

## 2018-12-28 ENCOUNTER — Other Ambulatory Visit: Payer: Self-pay

## 2018-12-28 DIAGNOSIS — Z86010 Personal history of colonic polyps: Secondary | ICD-10-CM

## 2018-12-28 DIAGNOSIS — Z800 Family history of malignant neoplasm of digestive organs: Secondary | ICD-10-CM

## 2018-12-28 NOTE — Telephone Encounter (Signed)
Gastroenterology Pre-Procedure Review  Request Date: 01/15/19 Requesting Physician: Dr. Allen Norris  PATIENT REVIEW QUESTIONS: The patient responded to the following health history questions as indicated:    1. Are you having any GI issues? yes (Constipation) 2. Do you have a personal history of Polyps? yes (few years ago) 3. Do you have a family history of Colon Cancer or Polyps? yes (father) 4. Diabetes Mellitus? no 5. Joint replacements in the past 12 months?no 6. Major health problems in the past 3 months?no 7. Any artificial heart valves, MVP, or defibrillator?no    MEDICATIONS & ALLERGIES:    Patient reports the following regarding taking any anticoagulation/antiplatelet therapy:   Plavix, Coumadin, Eliquis, Xarelto, Lovenox, Pradaxa, Brilinta, or Effient? no Aspirin? no  Patient confirms/reports the following medications:  Current Outpatient Medications  Medication Sig Dispense Refill  . montelukast (SINGULAIR) 10 MG tablet Take 1 tablet (10 mg total) by mouth daily. 30 tablet 2  . Multiple Vitamins-Minerals (MULTIVITAMIN ADULT PO) Take by mouth.     No current facility-administered medications for this visit.     Patient confirms/reports the following allergies:  Allergies  Allergen Reactions  . Tylenol [Acetaminophen]   . Ciprofloxacin Swelling and Rash    LIPS AND THROAT    No orders of the defined types were placed in this encounter.   AUTHORIZATION INFORMATION Primary Insurance: 1D#: Group #:  Secondary Insurance: 1D#: Group #:  SCHEDULE INFORMATION: Date: 01/15/19 Time: Location:[facility]

## 2019-01-08 ENCOUNTER — Encounter: Payer: Self-pay | Admitting: *Deleted

## 2019-01-08 ENCOUNTER — Other Ambulatory Visit: Payer: Self-pay

## 2019-01-11 ENCOUNTER — Other Ambulatory Visit
Admission: RE | Admit: 2019-01-11 | Discharge: 2019-01-11 | Disposition: A | Discharge status: Home / Self Care | Payer: BLUE CROSS/BLUE SHIELD | Source: Ambulatory Visit | Attending: Gastroenterology | Admitting: Gastroenterology

## 2019-01-11 ENCOUNTER — Other Ambulatory Visit: Payer: Self-pay

## 2019-01-11 DIAGNOSIS — Z01812 Encounter for preprocedural laboratory examination: Secondary | ICD-10-CM | POA: Insufficient documentation

## 2019-01-11 DIAGNOSIS — Z1159 Encounter for screening for other viral diseases: Secondary | ICD-10-CM | POA: Diagnosis not present

## 2019-01-11 LAB — SARS CORONAVIRUS 2 (TAT 6-24 HRS): SARS Coronavirus 2: NEGATIVE

## 2019-01-11 NOTE — Discharge Instructions (Signed)

## 2019-01-15 ENCOUNTER — Other Ambulatory Visit: Payer: Self-pay

## 2019-01-15 ENCOUNTER — Ambulatory Visit
Admission: RE | Admit: 2019-01-15 | Discharge: 2019-01-15 | Disposition: A | Discharge status: Home / Self Care | Payer: BLUE CROSS/BLUE SHIELD | Attending: Gastroenterology | Admitting: Gastroenterology

## 2019-01-15 ENCOUNTER — Encounter
Admission: RE | Disposition: A | Discharge status: Home / Self Care | Payer: Self-pay | Source: Home / Self Care | Attending: Gastroenterology

## 2019-01-15 ENCOUNTER — Ambulatory Visit: Payer: BLUE CROSS/BLUE SHIELD | Admitting: Anesthesiology

## 2019-01-15 DIAGNOSIS — Z86010 Personal history of colonic polyps: Secondary | ICD-10-CM | POA: Insufficient documentation

## 2019-01-15 DIAGNOSIS — J449 Chronic obstructive pulmonary disease, unspecified: Secondary | ICD-10-CM | POA: Insufficient documentation

## 2019-01-15 DIAGNOSIS — M8580 Other specified disorders of bone density and structure, unspecified site: Secondary | ICD-10-CM | POA: Diagnosis not present

## 2019-01-15 DIAGNOSIS — K641 Second degree hemorrhoids: Secondary | ICD-10-CM | POA: Diagnosis not present

## 2019-01-15 DIAGNOSIS — N189 Chronic kidney disease, unspecified: Secondary | ICD-10-CM | POA: Insufficient documentation

## 2019-01-15 DIAGNOSIS — K5909 Other constipation: Secondary | ICD-10-CM | POA: Diagnosis not present

## 2019-01-15 DIAGNOSIS — Z09 Encounter for follow-up examination after completed treatment for conditions other than malignant neoplasm: Secondary | ICD-10-CM | POA: Diagnosis not present

## 2019-01-15 HISTORY — PX: COLONOSCOPY WITH PROPOFOL: SHX5780

## 2019-01-15 SURGERY — COLONOSCOPY WITH PROPOFOL
Anesthesia: General | Site: Rectum

## 2019-01-15 MED ORDER — STERILE WATER FOR IRRIGATION IR SOLN
Status: DC | PRN
  Administered 2019-01-15: 15 mL

## 2019-01-15 MED ORDER — LACTATED RINGERS IV SOLN
10.0000 mL/h | INTRAVENOUS | Status: DC
  Administered 2019-01-15: 10 mL/h via INTRAVENOUS

## 2019-01-15 MED ORDER — PROPOFOL 10 MG/ML IV BOLUS
INTRAVENOUS | Status: DC | PRN
  Administered 2019-01-15 (×5): 50 mg via INTRAVENOUS

## 2019-01-15 MED ORDER — ONDANSETRON HCL 4 MG/2ML IJ SOLN
INTRAMUSCULAR | Status: DC | PRN
  Administered 2019-01-15: 4 mg via INTRAVENOUS

## 2019-01-15 MED ORDER — LIDOCAINE HCL (CARDIAC) PF 100 MG/5ML IV SOSY
PREFILLED_SYRINGE | INTRAVENOUS | Status: DC | PRN
  Administered 2019-01-15: 50 mg via INTRAVENOUS

## 2019-01-15 SURGICAL SUPPLY — 5 items
CANISTER SUCT 1200ML W/VALVE (MISCELLANEOUS) ×2 IMPLANT
GOWN CVR UNV OPN BCK APRN NK (MISCELLANEOUS) ×2 IMPLANT
GOWN ISOL THUMB LOOP REG UNIV (MISCELLANEOUS) ×2
KIT ENDO PROCEDURE OLY (KITS) ×2 IMPLANT
WATER STERILE IRR 250ML POUR (IV SOLUTION) ×2 IMPLANT

## 2019-01-15 NOTE — Op Note (Signed)
[facility] Gastroenterology Patient Name: Wendy Ashley Procedure Date: 01/15/2019 8:05 AM MRN: [id number] Account #: [id number] Date of Birth: 1956-03-15 Admit Type: Outpatient Age: 63 Room: [facility] OR ROOM 01 Gender: Female Note Status: Finalized Procedure:            Colonoscopy Indications:          High risk colon cancer surveillance: Personal history                        of colonic polyps Providers:            [name] MD, MD Referring MD:         Bethena Roys. Sowles, MD (Referring MD) Medicines:            Propofol per Anesthesia Complications:        No immediate complications. Procedure:            Pre-Anesthesia Assessment:                       - Prior to the procedure, a History and Physical was                        performed, and patient medications and allergies were                        reviewed. The patient's tolerance of previous                        anesthesia was also reviewed. The risks and benefits of                        the procedure and the sedation options and risks were                        discussed with the patient. All questions were                        answered, and informed consent was obtained. Prior                        Anticoagulants: The patient has taken no previous                        anticoagulant or antiplatelet agents. ASA Grade                        Assessment: II - A patient with mild systemic disease.                        After reviewing the risks and benefits, the patient was                        deemed in satisfactory condition to undergo the                        procedure.                       After obtaining informed consent, the colonoscope was  passed under direct vision. Throughout the procedure,                        the patient's blood pressure, pulse, and oxygen                        saturations were monitored continuously. The was   introduced through the anus and advanced to the the                        cecum, identified by appendiceal orifice and ileocecal                        valve. The colonoscopy was performed without                        difficulty. The patient tolerated the procedure well.                        The quality of the bowel preparation was excellent. Findings:      The perianal and digital rectal examinations were normal.      Non-bleeding internal hemorrhoids were found during retroflexion. The       hemorrhoids were Grade II (internal hemorrhoids that prolapse but reduce       spontaneously). Impression:           - Non-bleeding internal hemorrhoids.                       - No specimens collected. Recommendation:       - Discharge patient to home.                       - Resume previous diet.                       - Continue present medications.                       - Repeat colonoscopy in 5 years for surveillance. Procedure Code(s):    --- Professional ---                       [phone], Colonoscopy, flexible; diagnostic, including                        collection of specimen(s) by brushing or washing, when                        performed (separate procedure) Diagnosis Code(s):    --- Professional ---                       Z86.010, Personal history of colonic polyps CPT copyright 2019 American Medical Association. All rights reserved. The codes documented in this report are preliminary and upon coder review may  be revised to meet current compliance requirements. [name] MD, MD 01/15/2019 8:27:58 AM This report has been signed electronically. Number of Addenda: 0 Note Initiated On: 01/15/2019 8:05 AM Scope Withdrawal Time: 0 hours 7 minutes 6 seconds  Total Procedure Duration: 0 hours 13 minutes 12 seconds  Estimated Blood Loss: Estimated blood loss: none.      [facility]

## 2019-01-15 NOTE — Anesthesia Preprocedure Evaluation (Signed)
Anesthesia Evaluation  Patient identified by MRN, date of birth, ID band Patient awake    Reviewed: Allergy & Precautions, H&P , NPO status , Patient's Chart, lab work & pertinent test results  Airway Mallampati: II  TM Distance: >3 FB Neck ROM: full    Dental no notable dental hx.    Pulmonary COPD,    Pulmonary exam normal breath sounds clear to auscultation       Cardiovascular Normal cardiovascular exam Rhythm:regular Rate:Normal     Neuro/Psych    GI/Hepatic GERD  ,  Endo/Other    Renal/GU      Musculoskeletal   Abdominal   Peds  Hematology   Anesthesia Other Findings   Reproductive/Obstetrics                             Anesthesia Physical Anesthesia Plan  ASA: II  Anesthesia Plan: General   Post-op Pain Management:    Induction: Intravenous  PONV Risk Score and Plan: 3 and Propofol infusion and Treatment may vary due to age or medical condition  Airway Management Planned: Natural Airway  Additional Equipment:   Intra-op Plan:   Post-operative Plan:   Informed Consent: I have reviewed the patients History and Physical, chart, labs and discussed the procedure including the risks, benefits and alternatives for the proposed anesthesia with the patient or authorized representative who has indicated his/her understanding and acceptance.       Plan Discussed with: CRNA  Anesthesia Plan Comments:         Anesthesia Quick Evaluation

## 2019-01-15 NOTE — Anesthesia Postprocedure Evaluation (Signed)
Anesthesia Post Note  Patient: Wendy Ashley  Procedure(s) Performed: COLONOSCOPY WITH PROPOFOL (N/A Rectum)  Patient location during evaluation: PACU Anesthesia Type: General Level of consciousness: awake and alert and oriented Pain management: satisfactory to patient Vital Signs Assessment: post-procedure vital signs reviewed and stable Respiratory status: spontaneous breathing, nonlabored ventilation and respiratory function stable Cardiovascular status: blood pressure returned to baseline and stable Postop Assessment: Adequate PO intake and No signs of nausea or vomiting Anesthetic complications: no    Raliegh Ip

## 2019-01-15 NOTE — Transfer of Care (Signed)
Immediate Anesthesia Transfer of Care Note  Patient: Wendy Ashley  Procedure(s) Performed: COLONOSCOPY WITH PROPOFOL (N/A Rectum)  Patient Location: PACU  Anesthesia Type: General  Level of Consciousness: awake, alert  and patient cooperative  Airway and Oxygen Therapy: Patient Spontanous Breathing and Patient connected to supplemental oxygen  Post-op Assessment: Post-op Vital signs reviewed, Patient's Cardiovascular Status Stable, Respiratory Function Stable, Patent Airway and No signs of Nausea or vomiting  Post-op Vital Signs: Reviewed and stable  Complications: No apparent anesthesia complications
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
## 2019-01-16 ENCOUNTER — Encounter: Payer: Self-pay | Admitting: Gastroenterology

## 2019-04-03 ENCOUNTER — Encounter: Payer: Self-pay | Admitting: Family Medicine

## 2019-04-03 ENCOUNTER — Other Ambulatory Visit: Payer: Self-pay

## 2019-04-03 ENCOUNTER — Ambulatory Visit (INDEPENDENT_AMBULATORY_CARE_PROVIDER_SITE_OTHER): Payer: BLUE CROSS/BLUE SHIELD | Admitting: Family Medicine

## 2019-04-03 VITALS — BP 110/78 | HR 72 | Temp 96.6°F | Resp 14 | Ht 62.0 in | Wt 107.3 lb

## 2019-04-03 DIAGNOSIS — Z23 Encounter for immunization: Secondary | ICD-10-CM | POA: Diagnosis not present

## 2019-04-03 DIAGNOSIS — Z130 Encounter for screening for diseases of the blood and blood-forming organs and certain disorders involving the immune mechanism: Secondary | ICD-10-CM

## 2019-04-03 DIAGNOSIS — Z0000 Encounter for general adult medical examination without abnormal findings: Secondary | ICD-10-CM | POA: Diagnosis not present

## 2019-04-03 DIAGNOSIS — J4520 Mild intermittent asthma, uncomplicated: Secondary | ICD-10-CM

## 2019-04-03 DIAGNOSIS — Z1329 Encounter for screening for other suspected endocrine disorder: Secondary | ICD-10-CM

## 2019-04-03 DIAGNOSIS — Z13220 Encounter for screening for lipoid disorders: Secondary | ICD-10-CM | POA: Diagnosis not present

## 2019-04-03 DIAGNOSIS — Z2821 Immunization not carried out because of patient refusal: Secondary | ICD-10-CM

## 2019-04-03 DIAGNOSIS — Z13228 Encounter for screening for other metabolic disorders: Secondary | ICD-10-CM

## 2019-04-03 DIAGNOSIS — M8580 Other specified disorders of bone density and structure, unspecified site: Secondary | ICD-10-CM

## 2019-04-03 DIAGNOSIS — K219 Gastro-esophageal reflux disease without esophagitis: Secondary | ICD-10-CM

## 2019-04-03 DIAGNOSIS — R319 Hematuria, unspecified: Secondary | ICD-10-CM

## 2019-04-03 DIAGNOSIS — Z1231 Encounter for screening mammogram for malignant neoplasm of breast: Secondary | ICD-10-CM

## 2019-04-03 DIAGNOSIS — I10 Essential (primary) hypertension: Secondary | ICD-10-CM | POA: Diagnosis not present

## 2019-04-03 LAB — LIPID PANEL
Cholesterol: 205 mg/dL — ABNORMAL HIGH (ref <200)
HDL: 63 mg/dL (ref >=50)
LDL Cholesterol (Calc): 119 mg/dL (calc) — ABNORMAL HIGH
Non-HDL Cholesterol (Calc): 142 mg/dL (calc) — ABNORMAL HIGH (ref <130)
Total CHOL/HDL Ratio: 3.3 (calc) (ref <5.0)
Triglycerides: 120 mg/dL (ref <150)

## 2019-04-03 LAB — CBC WITH DIFFERENTIAL/PLATELET
Absolute Monocytes: 319 cells/uL (ref 200–950)
Basophils Absolute: 38 cells/uL (ref 0–200)
Basophils Relative: 1 %
Eosinophils Absolute: 209 cells/uL (ref 15–500)
Eosinophils Relative: 5.5 %
HCT: 43.6 % (ref 35.0–45.0)
Hemoglobin: 14.8 g/dL (ref 11.7–15.5)
Lymphs Abs: 1547 cells/uL (ref 850–3900)
MCH: 31.5 pg (ref 27.0–33.0)
MCHC: 33.9 g/dL (ref 32.0–36.0)
MCV: 92.8 fL (ref 80.0–100.0)
MPV: 11.4 fL (ref 7.5–12.5)
Monocytes Relative: 8.4 %
Neutro Abs: 1687 cells/uL (ref 1500–7800)
Neutrophils Relative %: 44.4 %
Platelets: 249 10*3/uL (ref 140–400)
RBC: 4.7 10*6/uL (ref 3.80–5.10)
RDW: 12.1 % (ref 11.0–15.0)
Total Lymphocyte: 40.7 %
WBC: 3.8 10*3/uL (ref 3.8–10.8)

## 2019-04-03 LAB — COMPLETE METABOLIC PANEL WITH GFR
AG Ratio: 1.8 (calc) (ref 1.0–2.5)
ALT: 11 U/L (ref 6–29)
AST: 17 U/L (ref 10–35)
Albumin: 4.4 g/dL (ref 3.6–5.1)
Alkaline phosphatase (APISO): 64 U/L (ref 37–153)
BUN: 14 mg/dL (ref 7–25)
CO2: 28 mmol/L (ref 20–32)
Calcium: 9.5 mg/dL (ref 8.6–10.4)
Chloride: 103 mmol/L (ref 98–110)
Creat: 0.74 mg/dL (ref 0.50–0.99)
GFR, Est African American: 101 mL/min/{1.73_m2} (ref >=60)
GFR, Est Non African American: 87 mL/min/{1.73_m2} (ref >=60)
Globulin: 2.4 g/dL (calc) (ref 1.9–3.7)
Glucose, Bld: 92 mg/dL (ref 65–99)
Potassium: 3.8 mmol/L (ref 3.5–5.3)
Sodium: 140 mmol/L (ref 135–146)
Total Bilirubin: 0.6 mg/dL (ref 0.2–1.2)
Total Protein: 6.8 g/dL (ref 6.1–8.1)

## 2019-04-03 NOTE — Patient Instructions (Signed)
Pneumococcal Vaccine, Polyvalent solution for injection What is this medicine? PNEUMOCOCCAL VACCINE, POLYVALENT (NEU mo KOK al vak SEEN, pol ee VEY luhnt) is a vaccine to prevent pneumococcus bacteria infection. These bacteria are a major cause of ear infections, Strep throat infections, and serious pneumonia, meningitis, or blood infections worldwide. These vaccines help the body to produce antibodies (protective substances) that help your body defend against these bacteria. This vaccine is recommended for people 43 years of age and older with health problems. It is also recommended for all adults over 36 years old. This vaccine will not treat an infection. This medicine may be used for other purposes; ask your health care provider or pharmacist if you have questions. COMMON BRAND NAME(S): Pneumovax 23 What should I tell my health care provider before I take this medicine? They need to know if you have any of these conditions:  bleeding problems  bone marrow or organ transplant  cancer, Hodgkin's disease  fever  infection  immune system problems  low platelet count in the blood  seizures  an unusual or allergic reaction to pneumococcal vaccine, diphtheria toxoid, other vaccines, latex, other medicines, foods, dyes, or preservatives  pregnant or trying to get pregnant  breast-feeding How should I use this medicine? This vaccine is for injection into a muscle or under the skin. It is given by a health care professional. A copy of Vaccine Information Statements will be given before each vaccination. Read this sheet carefully each time. The sheet may change frequently. Talk to your pediatrician regarding the use of this medicine in children. While this drug may be prescribed for children as young as 32 years of age for selected conditions, precautions do apply. Overdosage: If you think you have taken too much of this medicine contact a poison control center or emergency room at once.  NOTE: This medicine is only for you. Do not share this medicine with others. What if I miss a dose? It is important not to miss your dose. Call your doctor or health care professional if you are unable to keep an appointment. What may interact with this medicine?  medicines for cancer chemotherapy  medicines that suppress your immune function  medicines that treat or prevent blood clots like warfarin, enoxaparin, and dalteparin  steroid medicines like prednisone or cortisone This list may not describe all possible interactions. Give your health care provider a list of all the medicines, herbs, non-prescription drugs, or dietary supplements you use. Also tell them if you smoke, drink alcohol, or use illegal drugs. Some items may interact with your medicine. What should I watch for while using this medicine? Mild fever and pain should go away in 3 days or less. Report any unusual symptoms to your doctor or health care professional. What side effects may I notice from receiving this medicine? Side effects that you should report to your doctor or health care professional as soon as possible:  allergic reactions like skin rash, itching or hives, swelling of the face, lips, or tongue  breathing problems  confused  fever over 102 degrees F  pain, tingling, numbness in the hands or feet  seizures  unusual bleeding or bruising  unusual muscle weakness Side effects that usually do not require medical attention (report to your doctor or health care professional if they continue or are bothersome):  aches and pains  diarrhea  fever of 102 degrees F or less  headache  irritable  loss of appetite  pain, tender at site where injected  trouble sleeping This list may not describe all possible side effects. Call your doctor for medical advice about side effects. You may report side effects to FDA at 1-800-FDA-1088. Where should I keep my medicine? This does not apply. This vaccine  is given in a clinic, pharmacy, doctor's office, or other health care setting and will not be stored at home. NOTE: This sheet is a summary. It may not cover all possible information. If you have questions about this medicine, talk to your doctor, pharmacist, or health care provider.  2020 Elsevier/Gold Standard (2008-02-02 14:32:37)   Preventive Care 31-57 Years Old, Female Preventive care refers to visits with your health care provider and lifestyle choices that can promote health and wellness. This includes:  A yearly physical exam. This may also be called an annual well check.  Regular dental visits and eye exams.  Immunizations.  Screening for certain conditions.  Healthy lifestyle choices, such as eating a healthy diet, getting regular exercise, not using drugs or products that contain nicotine and tobacco, and limiting alcohol use. What can I expect for my preventive care visit? Physical exam Your health care provider will check your:  Height and weight. This may be used to calculate body mass index (BMI), which tells if you are at a healthy weight.  Heart rate and blood pressure.  Skin for abnormal spots. Counseling Your health care provider may ask you questions about your:  Alcohol, tobacco, and drug use.  Emotional well-being.  Home and relationship well-being.  Sexual activity.  Eating habits.  Work and work Statistician.  Method of birth control.  Menstrual cycle.  Pregnancy history. What immunizations do I need?  Influenza (flu) vaccine  This is recommended every year. Tetanus, diphtheria, and pertussis (Tdap) vaccine  You may need a Td booster every 10 years. Varicella (chickenpox) vaccine  You may need this if you have not been vaccinated. Zoster (shingles) vaccine  You may need this after age 46. Measles, mumps, and rubella (MMR) vaccine  You may need at least one dose of MMR if you were born in 1957 or later. You may also need a second  dose. Pneumococcal conjugate (PCV13) vaccine  You may need this if you have certain conditions and were not previously vaccinated. Pneumococcal polysaccharide (PPSV23) vaccine  You may need one or two doses if you smoke cigarettes or if you have certain conditions. Meningococcal conjugate (MenACWY) vaccine  You may need this if you have certain conditions. Hepatitis A vaccine  You may need this if you have certain conditions or if you travel or work in places where you may be exposed to hepatitis A. Hepatitis B vaccine  You may need this if you have certain conditions or if you travel or work in places where you may be exposed to hepatitis B. Haemophilus influenzae type b (Hib) vaccine  You may need this if you have certain conditions. Human papillomavirus (HPV) vaccine  If recommended by your health care provider, you may need three doses over 6 months. You may receive vaccines as individual doses or as more than one vaccine together in one shot (combination vaccines). Talk with your health care provider about the risks and benefits of combination vaccines. What tests do I need? Blood tests  Lipid and cholesterol levels. These may be checked every 5 years, or more frequently if you are over 33 years old.  Hepatitis C test.  Hepatitis B test. Screening  Lung cancer screening. You may have this screening every year starting  at age 30 if you have a 30-pack-year history of smoking and currently smoke or have quit within the past 15 years.  Colorectal cancer screening. All adults should have this screening starting at age 60 and continuing until age 68. Your health care provider may recommend screening at age 10 if you are at increased risk. You will have tests every 1-10 years, depending on your results and the type of screening test.  Diabetes screening. This is done by checking your blood sugar (glucose) after you have not eaten for a while (fasting). You may have this done every  1-3 years.  Mammogram. This may be done every 1-2 years. Talk with your health care provider about when you should start having regular mammograms. This may depend on whether you have a family history of breast cancer.  BRCA-related cancer screening. This may be done if you have a family history of breast, ovarian, tubal, or peritoneal cancers.  Pelvic exam and Pap test. This may be done every 3 years starting at age 43. Starting at age 75, this may be done every 5 years if you have a Pap test in combination with an HPV test. Other tests  Sexually transmitted disease (STD) testing.  Bone density scan. This is done to screen for osteoporosis. You may have this scan if you are at high risk for osteoporosis. Follow these instructions at home: Eating and drinking  Eat a diet that includes fresh fruits and vegetables, whole grains, lean protein, and low-fat dairy.  Take vitamin and mineral supplements as recommended by your health care provider.  Do not drink alcohol if: ? Your health care provider tells you not to drink. ? You are pregnant, may be pregnant, or are planning to become pregnant.  If you drink alcohol: ? Limit how much you have to 0-1 drink a day. ? Be aware of how much alcohol is in your drink. In the [place] one drink equals one 12 oz bottle of beer (355 mL), one 5 oz glass of wine (148 mL), or one 1 oz glass of hard liquor (44 mL). Lifestyle  Take daily care of your teeth and gums.  Stay active. Exercise for at least 30 minutes on 5 or more days each week.  Do not use any products that contain nicotine or tobacco, such as cigarettes, e-cigarettes, and chewing tobacco. If you need help quitting, ask your health care provider.  If you are sexually active, practice safe sex. Use a condom or other form of birth control (contraception) in order to prevent pregnancy and STIs (sexually transmitted infections).  If told by your health care provider, take low-dose aspirin daily  starting at age 60. What's next?  Visit your health care provider once a year for a well check visit.  Ask your health care provider how often you should have your eyes and teeth checked.  Stay up to date on all vaccines. This information is not intended to replace advice given to you by your health care provider. Make sure you discuss any questions you have with your health care provider. Document Released: 07/25/2015 Document Revised: 03/09/2018 Document Reviewed: 03/09/2018 Elsevier Patient Education  2020 [employer].

## 2019-04-03 NOTE — Progress Notes (Signed)
Patient: Wendy Ashley, Female    DOB: 20-May-1956, 63 y.o.   MRN: [id number] Steele Sizer, MD Visit Date: 04/03/2019  Today's Provider: Delsa Grana, PA-C   Chief Complaint  Patient presents with  . Annual Exam   Subjective:   Annual physical exam:  Wendy Ashley is a 63 y.o. female who presents today for health maintenance and annual & complete physical exam.   Exercise/Activity:  Walking 3x a week, lifts weights  Diet/nutrition:  Vegetarian mostly, healthy, no change  She reports she is sleeping well.  Pt does not wish to discuss acute complaints or do f/up chronic conditions.  Advised pt of separate visit - diagnosis and info included in this encounter were only done so because pt is new to me and this information was obtained in reviewing problem list, med hx, past screenings, family hx, etc.  She is not on any medications, wants to avoid meds as much as she can. She has family hx of colon cancer and her mother had heart disease/atherosclerosis, but she does not know if she had any events and any specific age and just knows she has heart disease.  She wanted to recheck Vit d, but last lab was normal and we discussed cost of labs, and she decided she did not want labs - routine labs A1C and TSH were also not covered by insurance so they were cancelled  USPSTF grade A and B recommendations - reviewed and addressed today  Depression:  Phq 9 completed today by patient, was reviewed by me with patient in the room, score is  negative, pt feels good PHQ 2/9 Scores 04/03/2019 05/09/2018 02/16/2017 11/04/2015  PHQ - 2 Score 0 0 0 0  PHQ- 9 Score 0 0 0 -   Depression screen [facility] 2/9 04/03/2019 05/09/2018 02/16/2017 11/04/2015  Decreased Interest 0 0 0 0  Down, Depressed, Hopeless 0 0 0 0  PHQ - 2 Score 0 0 0 0  Altered sleeping 0 0 0 -  Tired, decreased energy 0 0 0 -  Change in appetite 0 0 0 -  Feeling bad or failure about yourself  0 0 0 -  Trouble concentrating 0 0 0 -  Moving  slowly or fidgety/restless 0 0 0 -  Suicidal thoughts 0 0 0 -  PHQ-9 Score 0 0 0 -  Difficult doing work/chores Not difficult at all Not difficult at all - -   Health Maintenance  Topic Date Due  . INFLUENZA VACCINE  10/10/2019 (Originally 02/10/2019)  . MAMMOGRAM  06/06/2019  . PAP SMEAR-Modifier  09/04/2019  . COLONOSCOPY  01/15/2024  . TETANUS/TDAP  11/03/2025  . Hepatitis C Screening  Completed  . HIV Screening  Completed   Health Maintenance reviewed - everything UTD as noted above Only Due for flu shot today  Immunizations:   Pneumoccocal?  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 Pt does not want  Hep C Screening: done  STD testing and prevention (HIV/chl/gon/syphilis): done, no additional testing wanted today  Intimate partner violence:denies, feels safe (husband was in the room the entire time so she did not have opportunity with me to answer any questions alone)  Sexual History/Pain during Intercourse: Married - some pain with sex, deep penitration causes dyspareunia - she has seen GYN in the past for the complaint and it was worked up  Menstrual History/LMP/Abnormal Bleeding:  At 63 y/o  she went through menopause, she denies any vaginal bleeding Tubes tied no other surgeries No LMP recorded (lmp unknown). Patient is postmenopausal.   Incontinence Symptoms:  No incontinence Hx of UTI and hematuria - has gone to specialists  Breast cancer:  Mammogram done, last from 06/05/2018 reviewed, BI-Rads category 1, annual screening, due in 2 months - new order put in BRCA gene screening: none done in the past Cervical cancer screening: UTD, due next year Family hx of cancers -FAMILY hx of colon CA,  No family hx of breast, ovarian, uterine cancers  Osteoporosis:   Screening done several in the past 2013, 2014, and last was 2016 that I can see in chart, Hx of osteopenia  Pt is supplementing  with daily calcium/Vit D.  She reports previous hx of Vit D deficiency - last labs in 2017 Vit D was normal  Skin cancer:  Skin CA Hx:  no Last skin survey: she doesn't know  Colorectal cancer:   colonoscopy is UTD  Lung cancer:    Low Dose CT Chest recommended if Age 58-80 years, 30 pack-year currently smoking OR have quit w/in 15years. Patient does not qualify.    Social History   Tobacco Use  . Smoking status: Never Smoker  . Smokeless tobacco: Never Used  Substance Use Topics  . Alcohol use: Yes    Alcohol/week: 2.0 standard drinks    Types: 2 Glasses of wine per week    Comment: on occasion    Alcohol screening:   [facility] 04/03/2019 in [facility]  AUDIT-C Score  3     Blood pressure/Hypertension: BP Readings from Last 3 Encounters:  04/03/19 110/78  01/15/19 122/78  05/24/18 130/70   Weight/Obesity: Wt Readings from Last 3 Encounters:  04/03/19 107 lb 4.8 oz (48.7 kg)  01/15/19 110 lb (49.9 kg)  05/24/18 116 lb (52.6 kg)   BMI Readings from Last 3 Encounters:  04/03/19 19.63 kg/m  01/15/19 20.12 kg/m  05/24/18 21.22 kg/m    Lipids:  Lab Results  Component Value Date   CHOL 226 (H) 05/09/2018   CHOL 192 02/16/2017   CHOL 195 11/04/2015   Lab Results  Component Value Date   HDL 62 05/09/2018   HDL 57 02/16/2017   HDL 65 11/04/2015   Lab Results  Component Value Date   LDLCALC 142 (H) 05/09/2018   LDLCALC 121 (H) 02/16/2017   LDLCALC 110 (H) 11/04/2015   Lab Results  Component Value Date   TRIG 104 05/09/2018   TRIG 71 02/16/2017   TRIG 102 11/04/2015   Lab Results  Component Value Date   CHOLHDL 3.6 05/09/2018   No results found for: LDLDIRECT Based on the results of lipid panel his/her cardiovascular risk factor ( using [place] )  in the next 10 years is: The 10-year ASCVD risk score Mikey Bussing DC Brooke Bonito., et al., 2013) is: 3.1%   Values used to calculate the score:     Age: 69 years     Sex: Female      Is Non-Hispanic African American: No     Diabetic: No     Tobacco smoker: No     Systolic Blood Pressure: 176 mmHg     Is BP treated: No     HDL Cholesterol: 62 mg/dL     Total Cholesterol: 226 mg/dL Glucose:  Glucose  Date Value Ref Range Status  06/08/2017 68 65 - 99 mg/dL Final  02/16/2017 88 65 - 99 mg/dL Final  11/04/2015 78 65 - 99 mg/dL Final  06/04/2013 101 (H) 65 - 99 mg/dL Final   Glucose, Bld  Date Value Ref Range Status  05/09/2018 92 65 - 139 mg/dL Final    Comment:    .        Non-fasting reference interval .     Advanced Care Planning:  A voluntary discussion about advance care planning including the explanation and discussion of advance directives.   Discussed health care proxy and Living will, and the patient was able to identify a health care proxy as Husband, Shaniece Bussa.  Patient does not have a living will at present time. They do not want a packet today but started to discuss it and will work on it, they were asked to bring a copy by the office to scan into chart if advanced directives are completed  Social History      She  reports that she has never smoked. She has never used smokeless tobacco. She reports current alcohol use of about 2.0 standard drinks of alcohol per week. She reports that she does not use drugs.       Social History   Socioeconomic History  . Marital status: Married    Spouse name: Anne Hahn  . Number of children: 3  . Years of education: Not on file  . Highest education level: Bachelor's degree (e.g., BA, AB, BS)  Occupational History  . Occupation: unemployed  Social Needs  . Financial resource strain: Somewhat hard  . Food insecurity    Worry: Never true    Inability: Never true  . Transportation needs    Medical: No    Non-medical: No  Tobacco Use  . Smoking status: Never Smoker  . Smokeless tobacco: Never Used  Substance and Sexual Activity  . Alcohol use: Yes    Alcohol/week: 2.0 standard drinks    Types: 2  Glasses of wine per week    Comment: on occasion  . Drug use: No  . Sexual activity: Yes    Partners: Male  Lifestyle  . Physical activity    Days per week: 3 days    Minutes per session: 60 min  . Stress: Not at all  Relationships  . Social connections    Talks on phone: More than three times a week    Gets together: Once a week    Attends religious service: More than 4 times per year    Active member of club or organization: No    Attends meetings of clubs or organizations: Never    Relationship status: Married  Other Topics Concern  . Not on file  Social History Narrative   Originally from [place], moved to [place] with husband from up [place].     Family History        Family Status  Relation Name Status  . Mother  Alive  . Father  Deceased       kidney failure  . Sister  Alive  . Brother  Alive  . Daughter  Alive  . Son  Alive  . MGM  Deceased  . MGF  Deceased  . PGM  Deceased  . PGF  Deceased  . Sister  Alive  . Sister  Alive  . Brother Risk analyst  . Sister  Alive  . Sister  Alive  . Son  Alive  . Neg Hx  (Not Specified)        Her family history includes Alzheimer's disease in her father; Cancer in  her father; Hypertension in her brother, mother, sister, and sister. There is no history of Breast cancer.       Family History  Problem Relation Age of Onset  . Hypertension Mother   . Alzheimer's disease Father   . Cancer Father        colon  . Hypertension Sister   . Hypertension Brother   . Hypertension Sister   . Breast cancer Neg Hx     Patient Active Problem List   Diagnosis Date Noted  . Essential hypertension, benign 05/25/2017  . Hip pain, chronic, left 02/16/2017  . Personal history of colonic polyps   . Benign neoplasm of transverse colon   . Hematuria 11/04/2015  . Lung nodule < 6cm on CT 08/19/2015  . Asthma 08/19/2015  . Chronic constipation 01/22/2015  . GERD (gastroesophageal reflux disease) 01/22/2015  . Osteopenia 01/22/2015     Past Surgical History:  Procedure Laterality Date  . COLONOSCOPY WITH PROPOFOL N/A 12/22/2015   Procedure: COLONOSCOPY WITH PROPOFOL;  Surgeon: [name], MD;  Location: [facility];  Service: Endoscopy;  Laterality: N/A;  . COLONOSCOPY WITH PROPOFOL N/A 01/15/2019   Procedure: COLONOSCOPY WITH PROPOFOL;  Surgeon: [name], MD;  Location: [facility];  Service: Endoscopy;  Laterality: N/A;  . CYSTOSCOPY WITH BIOPSY N/A 09/17/2015   Procedure: CYSTOSCOPY WITH BIOPSY;  Surgeon: Nickie Retort, MD;  Location: [facility];  Service: Urology;  Laterality: N/A;  . CYSTOSCOPY WITH FULGERATION N/A 09/17/2015   Procedure: CYSTOSCOPY WITH FULGERATION;  Surgeon: Nickie Retort, MD;  Location: [facility];  Service: Urology;  Laterality: N/A;  . POLYPECTOMY  12/22/2015   Procedure: POLYPECTOMY;  Surgeon: [name], MD;  Location: [place];  Service: Endoscopy;;  . TUBAL LIGATION      No current outpatient medications on file.  Allergies  Allergen Reactions  . Tylenol [Acetaminophen] Swelling    Lips  . Ciprofloxacin Swelling and Rash    LIPS AND THROAT    Patient Care Team: Steele Sizer, MD as PCP - General (Family Medicine)  I personally reviewed active problem list, medication list, allergies, family history, social history, health maintenance, notes from last encounter, lab results, imaging with the patient/caregiver today.  Review of Systems  Constitutional: Negative.  Negative for activity change, appetite change, fatigue and unexpected weight change.  HENT: Negative.   Eyes: Negative.   Respiratory: Negative.  Negative for shortness of breath.   Cardiovascular: Negative.  Negative for chest pain, palpitations and leg swelling.  Gastrointestinal: Negative.  Negative for abdominal pain and blood in stool.  Endocrine: Negative.   Genitourinary: Negative.   Musculoskeletal: Negative.  Negative for arthralgias, gait problem, joint swelling and myalgias.   Skin: Negative.  Negative for color change, pallor and rash.  Allergic/Immunologic: Negative.   Neurological: Negative.  Negative for syncope and weakness.  Hematological: Negative.   Psychiatric/Behavioral: Negative.  Negative for confusion, dysphoric mood, self-injury and suicidal ideas. The patient is not nervous/anxious.   All other systems reviewed and are negative.         Objective:   Vitals:  Vitals:   04/03/19 0812  BP: 110/78  Pulse: 72  Resp: 14  Temp: (!) 96.6 F (35.9 C)  SpO2: 99%  Weight: 107 lb 4.8 oz (48.7 kg)  Height: 5' 2"  (1.575 m)    Body mass index is 19.63 kg/m.  Physical Exam Vitals signs and nursing note reviewed.  Constitutional:      General: She is not  in acute distress.    Appearance: Normal appearance. She is well-developed. She is not ill-appearing, toxic-appearing or diaphoretic.  HENT:     Head: Normocephalic and atraumatic.     Right Ear: Tympanic membrane, ear canal and external ear normal.     Left Ear: Tympanic membrane, ear canal and external ear normal.     Nose: Nose normal. No congestion or rhinorrhea.     Mouth/Throat:     Mouth: Mucous membranes are moist.     Pharynx: Oropharynx is clear. Uvula midline. Posterior oropharyngeal erythema (mild) present. No oropharyngeal exudate.  Eyes:     General: Lids are normal.        Right eye: No discharge.        Left eye: No discharge.     Conjunctiva/sclera: Conjunctivae normal.     Pupils: Pupils are equal, round, and reactive to light.  Neck:     Musculoskeletal: Normal range of motion and neck supple. No neck rigidity.     Thyroid: No thyroid mass, thyromegaly or thyroid tenderness.     Vascular: No carotid bruit.     Trachea: Phonation normal. No tracheal deviation.  Cardiovascular:     Rate and Rhythm: Normal rate and regular rhythm.     Pulses: Normal pulses.          Radial pulses are 2+ on the right side and 2+ on the left side.       Posterior tibial pulses are 2+ on  the right side and 2+ on the left side.     Heart sounds: Normal heart sounds. No murmur. No friction rub. No gallop.   Pulmonary:     Effort: Pulmonary effort is normal. No respiratory distress.     Breath sounds: Normal breath sounds. No stridor. No wheezing, rhonchi or rales.  Chest:     Chest wall: No tenderness.  Abdominal:     General: Bowel sounds are normal. There is no distension.     Palpations: Abdomen is soft.     Tenderness: There is no abdominal tenderness. There is no guarding or rebound.  Musculoskeletal: Normal range of motion.        General: No deformity.     Right lower leg: No edema.     Left lower leg: No edema.  Lymphadenopathy:     Cervical: No cervical adenopathy.  Skin:    General: Skin is warm and dry.     Capillary Refill: Capillary refill takes less than 2 seconds.     Coloration: Skin is not jaundiced or pale.     Findings: No rash.  Neurological:     Mental Status: She is alert and oriented to person, place, and time.     Motor: No weakness or abnormal muscle tone.     Coordination: Coordination normal.     Gait: Gait normal.  Psychiatric:        Mood and Affect: Mood normal.        Speech: Speech normal.        Behavior: Behavior normal.     Fall Risk: Fall Risk  04/03/2019 05/09/2018 02/16/2017 11/04/2015  Falls in the past year? 1 No No No  Number falls in past yr: 0 - - -  Injury with Fall? 0 - - -    Functional Status Survey: Is the patient deaf or have difficulty hearing?: No Does the patient have difficulty seeing, even when wearing glasses/contacts?: No Does the patient have difficulty concentrating, remembering, or making  decisions?: No Does the patient have difficulty walking or climbing stairs?: No Does the patient have difficulty dressing or bathing?: No Does the patient have difficulty doing errands alone such as visiting a doctor's office or shopping?: No   Assessment & Plan:    CPE completed today  . USPSTF grade A and B  recommendations reviewed with patient; age-appropriate recommendations, preventive care, screening tests, etc discussed and encouraged; healthy living encouraged; see AVS for patient education given to patient  . Discussed importance of 150 minutes of physical activity weekly, AHA exercise recommendations given to pt in AVS/handout  . Discussed importance of healthy diet:  eating lean meats and proteins, avoiding trans fats and saturated fats, avoid simple sugars and excessive carbs in diet, eat 6 servings of fruit/vegetables daily and drink plenty of water and avoid sweet beverages.   . Recommended pt to do annual eye exam and routine dental exams/cleanings  . Depression, alcohol, fall screening completed as documented above and per flowsheets, and negative  . Reviewed Health Maintenance: Health Maintenance  Topic Date Due  . INFLUENZA VACCINE  10/10/2019 (Originally 02/10/2019)  . MAMMOGRAM  06/06/2019  . PAP SMEAR-Modifier  09/04/2019  . COLONOSCOPY  01/15/2024  . TETANUS/TDAP  11/03/2025  . Hepatitis C Screening  Completed  . HIV Screening  Completed    . Immunizations: Immunization History  Administered Date(s) Administered  . Influenza-Unspecified 07/08/2015  . Pneumococcal Polysaccharide-23 04/03/2019  . Td 07/12/2005  . Tdap 11/04/2015     ICD-10-CM   1. Adult general medical exam  Z00.00 CBC with Differential/Platelet    COMPLETE METABOLIC PANEL WITH GFR    Lipid panel    CANCELED: Hemoglobin A1c    CANCELED: TSH  2. Screening for endocrine, metabolic and immunity disorder  Z13.29 CBC with Differential/Platelet   O03.212 COMPLETE METABOLIC PANEL WITH GFR   Z13.0 CANCELED: Hemoglobin A1c    CANCELED: TSH  3. Screening for lipoid disorders  Y48.250 COMPLETE METABOLIC PANEL WITH GFR    Lipid panel  4. Essential hypertension, benign  I10    pt denies hx of, no meds, BP well controlled today  5. Gastroesophageal reflux disease without esophagitis  K21.9    avoiding food  triggers, PRN pepcid  6. Osteopenia, unspecified location  M85.80 DG Bone Density   supplementing Ca 600 mg + VIt D, last dexa 4 years ago, discussed bone scan and other medications if density is worse, she is doing exercises and supplements  7. Mild intermittent asthma without complication  I37.04    pt denies asthma, says hx of TB and told she has COPD but other doctor told her she did not have COPD, hx of pneumonia x2 and some "scarring" not using meds  8. Hematuria, unspecified type  R31.9    went to specialist - dx with "chronic UTI", no active sx or concerns  9. Breast cancer screening by mammogram  Z12.31 MM 3D SCREEN BREAST BILATERAL   due Nov 2020, ordered and HM tab updated to reflex annual screening  10. Need for 23-polyvalent pneumococcal polysaccharide vaccine  Z23 Pneumococcal polysaccharide vaccine 23-valent greater than or equal to 2yo subcutaneous/IM  11. Influenza vaccine refused  Z28.21       Delsa Grana, PA-C 04/03/19 9:37 AM  [facility]

## 2019-04-11 ENCOUNTER — Telehealth: Payer: Self-pay

## 2019-04-11 NOTE — Telephone Encounter (Signed)
Copied from [facility] [id number]. Topic: Complaint - Billing/Coding >> Apr 11, 2019 12:04 PM Celene Kras A wrote: DOS: 01/17/2019 Details of complaint: Pts husband called stating the GI doctor his wife was sent to was not in network with pts insurance.  How would the patient like to see this issue resolved? Pts husband is requesting to know why pt was sent to someone who was not in network with their insurance. Please advise.    Will automatically be routed to [facility].   Patient was calling about her colonoscopy bill stating that they were out of network, but according to the notes they reached out to her on 01/01/19 regarding her insurance and she was supposed to give them a copy of the new card. The new card was not scanned in until 04/03/2019. I advised her to contact their office to see if they can resubmit it with the new information and she said ok.

## 2020-03-14 ENCOUNTER — Ambulatory Visit: Payer: BLUE CROSS/BLUE SHIELD | Admitting: Internal Medicine

## 2020-05-14 NOTE — Progress Notes (Signed)
Name: Wendy Ashley   MRN: [id number]    DOB: 1956/07/05   Date:05/15/2020       Progress Note  Subjective  Chief Complaint  CPE  HPI  Patient presents for annual CPE and follow up  Hematuria: had CT scan abdomen done in 2016 a lung nodule was found and also atherosclerosis of aorta on chest CT done in 2016 and repeat in 2019 , per Dr. Duwayne Heck she was supposed to have it repeated in 2020 , she did not go back but wiling to have it done now   GERD: very seldom has heartburn, usually when she has spicy food  Chronic constipation: she takes green tea three times a week to have Bristol scale stools of 3, otherwise she has to strain and is scale 1 and only about once a week, without taking the tea  Hematuria: had CT done in 2016, did not see Urologist, states it was due to an UTI, we will recheck urine micro   Diet: balanced diet  Exercise: continue physical activity     Office Visit from 05/15/2020 in [facility]  AUDIT-C Score 0     Depression: Phq 9 is  negative Depression screen [facility] 2/9 05/15/2020 04/03/2019 05/09/2018 02/16/2017 11/04/2015  Decreased Interest 0 0 0 0 0  Down, Depressed, Hopeless 0 0 0 0 0  PHQ - 2 Score 0 0 0 0 0  Altered sleeping 0 0 0 0 -  Tired, decreased energy 0 0 0 0 -  Change in appetite 0 0 0 0 -  Feeling bad or failure about yourself  0 0 0 0 -  Trouble concentrating 0 0 0 0 -  Moving slowly or fidgety/restless 0 0 0 0 -  Suicidal thoughts 0 0 0 0 -  PHQ-9 Score 0 0 0 0 -  Difficult doing work/chores - Not difficult at all Not difficult at all - -   Hypertension: BP Readings from Last 3 Encounters:  05/15/20 112/76  04/03/19 110/78  01/15/19 122/78   Obesity: Wt Readings from Last 3 Encounters:  05/15/20 108 lb 9.6 oz (49.3 kg)  04/03/19 107 lb 4.8 oz (48.7 kg)  01/15/19 110 lb (49.9 kg)   BMI Readings from Last 3 Encounters:  05/15/20 19.86 kg/m  04/03/19 19.63 kg/m  01/15/19 20.12 kg/m     Vaccines:   Shingrix:  53-64 yo and ask insurance if covered when patient above 73 yo Pneumonia: educated and discussed with patient. Flu: educated and discussed with patient.- refused   Hep C Screening: 11/04/2015 STD testing and prevention (HIV/chl/gon/syphilis): 11/04/2015 Intimate partner violence:negative screen  Sexual History : no pain during intercourse  Menstrual History/LMP/Abnormal Bleeding: discussed post-menopausal bleeding  Incontinence Symptoms: she denies urge or stress stress incontinence   Breast cancer:  - Last Mammogram: Ordered at today's visit - BRCA gene screening: N/A  Osteoporosis: Discussed high calcium and vitamin D supplementation, weight bearing exercises  Cervical cancer screening: Ordered at today's visit  Skin cancer: Discussed monitoring for atypical lesions  Colorectal cancer: 01/15/2019   Lung cancer:   Low Dose CT Chest recommended if Age 74-80 years, 20 pack-year currently smoking OR have quit w/in 15years. Patient does not qualify.   ECG: 05/15/2020  Advanced Care Planning: A voluntary discussion about advance care planning including the explanation and discussion of advance directives.  Discussed health care proxy and Living will, and the patient was able to identify a health care proxy as hsuband .  Patient does  not have a living will at present time.  Lipids: Lab Results  Component Value Date   CHOL 205 (H) 04/03/2019   CHOL 226 (H) 05/09/2018   CHOL 192 02/16/2017   Lab Results  Component Value Date   HDL 63 04/03/2019   HDL 62 05/09/2018   HDL 57 02/16/2017   Lab Results  Component Value Date   LDLCALC 119 (H) 04/03/2019   LDLCALC 142 (H) 05/09/2018   LDLCALC 121 (H) 02/16/2017   Lab Results  Component Value Date   TRIG 120 04/03/2019   TRIG 104 05/09/2018   TRIG 71 02/16/2017   Lab Results  Component Value Date   CHOLHDL 3.3 04/03/2019   CHOLHDL 3.6 05/09/2018   No results found for: LDLDIRECT  Glucose: Glucose  Date Value Ref Range Status   06/08/2017 68 65 - 99 mg/dL Final  02/16/2017 88 65 - 99 mg/dL Final  11/04/2015 78 65 - 99 mg/dL Final  06/04/2013 101 (H) 65 - 99 mg/dL Final   Glucose, Bld  Date Value Ref Range Status  04/03/2019 92 65 - 99 mg/dL Final    Comment:    .            Fasting reference interval .   05/09/2018 92 65 - 139 mg/dL Final    Comment:    .        Non-fasting reference interval .     Patient Active Problem List   Diagnosis Date Noted  . Atherosclerosis of aorta ([place]) 05/15/2020  . Hip pain, chronic, left 02/16/2017  . Personal history of colonic polyps   . Benign neoplasm of transverse colon   . Lung nodule < 6cm on CT 08/19/2015  . Chronic constipation 01/22/2015  . GERD (gastroesophageal reflux disease) 01/22/2015  . Osteopenia 01/22/2015    Past Surgical History:  Procedure Laterality Date  . COLONOSCOPY WITH PROPOFOL N/A 12/22/2015   Procedure: COLONOSCOPY WITH PROPOFOL;  Surgeon: [name], MD;  Location: [place];  Service: Endoscopy;  Laterality: N/A;  . COLONOSCOPY WITH PROPOFOL N/A 01/15/2019   Procedure: COLONOSCOPY WITH PROPOFOL;  Surgeon: [name], MD;  Location: [place];  Service: Endoscopy;  Laterality: N/A;  . CYSTOSCOPY WITH BIOPSY N/A 09/17/2015   Procedure: CYSTOSCOPY WITH BIOPSY;  Surgeon: Nickie Retort, MD;  Location: [facility];  Service: Urology;  Laterality: N/A;  . CYSTOSCOPY WITH FULGERATION N/A 09/17/2015   Procedure: CYSTOSCOPY WITH FULGERATION;  Surgeon: Nickie Retort, MD;  Location: [facility];  Service: Urology;  Laterality: N/A;  . POLYPECTOMY  12/22/2015   Procedure: POLYPECTOMY;  Surgeon: [name], MD;  Location: [place];  Service: Endoscopy;;  . TUBAL LIGATION      Family History  Problem Relation Age of Onset  . Hypertension Mother   . Alzheimer's disease Father   . Cancer Father        colon  . Hypertension Sister   . Hypertension Brother   . Hypertension Sister   . Breast cancer Neg Hx      Social History   Socioeconomic History  . Marital status: Married    Spouse name: Anne Hahn  . Number of children: 3  . Years of education: Not on file  . Highest education level: Bachelor's degree (e.g., BA, AB, BS)  Occupational History  . Occupation: unemployed  Tobacco Use  . Smoking status: Never Smoker  . Smokeless tobacco: Never Used  Vaping Use  . Vaping Use: Never used  Substance and  Sexual Activity  . Alcohol use: Yes    Alcohol/week: 2.0 standard drinks    Types: 2 Glasses of wine per week    Comment: on occasion  . Drug use: No  . Sexual activity: Yes    Partners: Male  Other Topics Concern  . Not on file  Social History Narrative   Originally from [place], moved to [place] with husband from up [place].    Social Determinants of Health   Financial Resource Strain: Low Risk   . Difficulty of Paying Living Expenses: Not hard at all  Food Insecurity: No Food Insecurity  . Worried About Charity fundraiser in the Last Year: Never true  . Ran Out of Food in the Last Year: Never true  Transportation Needs: No Transportation Needs  . Lack of Transportation (Medical): No  . Lack of Transportation (Non-Medical): No  Physical Activity: Sufficiently Active  . Days of Exercise per Week: 3 days  . Minutes of Exercise per Session: 60 min  Stress: No Stress Concern Present  . Feeling of Stress : Not at all  Social Connections: Moderately Integrated  . Frequency of Communication with Friends and Family: More than three times a week  . Frequency of Social Gatherings with Friends and Family: Once a week  . Attends Religious Services: More than 4 times per year  . Active Member of Clubs or Organizations: No  . Attends Archivist Meetings: Never  . Marital Status: Married  Human resources officer Violence: Not At Risk  . Fear of Current or Ex-Partner: No  . Emotionally Abused: No  . Physically Abused: No  . Sexually Abused: No     Current Outpatient Medications:  Marland Kitchen   Multiple Vitamin (MULTIVITAMIN) tablet, Take 1 tablet by mouth daily., Disp: , Rfl:   Allergies  Allergen Reactions  . Tylenol [Acetaminophen] Swelling    Lips  . Ciprofloxacin Swelling and Rash    LIPS AND THROAT     ROS  Constitutional: Negative for fever or weight change.  Respiratory: Negative for cough and shortness of breath.   Cardiovascular: Negative for chest pain or palpitations.  Gastrointestinal: Negative for abdominal pain, no bowel changes.  Musculoskeletal: Negative for gait problem or joint swelling.  Skin: Negative for rash.  Neurological: Negative for dizziness or headache.  No other specific complaints in a complete review of systems (except as listed in HPI above).  Objective  Vitals:   05/15/20 0900  BP: 112/76  Pulse: 89  Resp: 14  Temp: 97.9 F (36.6 C)  TempSrc: Oral  SpO2: 99%  Weight: 108 lb 9.6 oz (49.3 kg)  Height: 5' 2"  (1.575 m)    Body mass index is 19.86 kg/m.  Physical Exam  Constitutional: Patient appears well-developed and well-nourished. No distress.  HENT: Head: Normocephalic and atraumatic. Ears: B TMs ok, no erythema or effusion; Nose: Nose normal. Mouth/Throat: Oropharynx is clear and moist. No oropharyngeal exudate.  Eyes: Conjunctivae and EOM are normal. Pupils are equal, round, and reactive to light. No scleral icterus.  Neck: Normal range of motion. Neck supple. No JVD present. No thyromegaly present.  Cardiovascular: Normal rate, regular rhythm and normal heart sounds.  No murmur heard. No BLE edema. Pulmonary/Chest: Effort normal and breath sounds normal. No respiratory distress. Abdominal: Soft. Bowel sounds are normal, no distension. There is no tenderness. no masses Breast: no lumps or masses, no nipple discharge or rashes FEMALE GENITALIA:  External genitalia normal External urethra normal Vaginal vault normal without discharge or  lesions Cervix normal without discharge or lesions Bimanual exam normal without  masses RECTAL: not done  Musculoskeletal: Normal range of motion, no joint effusions. No gross deformities Neurological: he is alert and oriented to person, place, and time. No cranial nerve deficit. Coordination, balance, strength, speech and gait are normal.  Skin: Skin is warm and dry. No rash noted. No erythema.  Psychiatric: Patient has a normal mood and affect. behavior is normal. Judgment and thought content normal.  Fall Risk: Fall Risk  05/15/2020 04/03/2019 05/09/2018 02/16/2017 11/04/2015  Falls in the past year? 0 1 No No No  Number falls in past yr: 0 0 - - -  Injury with Fall? 0 0 - - -     Functional Status Survey: Is the patient deaf or have difficulty hearing?: No Does the patient have difficulty seeing, even when wearing glasses/contacts?: No Does the patient have difficulty concentrating, remembering, or making decisions?: No Does the patient have difficulty walking or climbing stairs?: No Does the patient have difficulty dressing or bathing?: No Does the patient have difficulty doing errands alone such as visiting a doctor's office or shopping?: No   Assessment & Plan  1. Well adult exam  - CBC with Differential - Lipid Profile - COMPLETE METABOLIC PANEL WITH GFR - Cytology - PAP  2. Cervical cancer screening  - Cytology - PAP  3. Atherosclerosis of aorta ([place])  She refuses statin therapy  4. Gastroesophageal reflux disease without esophagitis   5. Breast cancer screening by mammogram  - MM 3D SCREEN BREAST BILATERAL; Future  6. Need for shingles vaccine  - Varicella-zoster vaccine IM  7. Abnormal CT of the chest  - CT Chest Wo Contrast; Future  8. Hematuria, unspecified type  - Urinalysis, microscopic only  9. Chronic constipation   -USPSTF grade A and B recommendations reviewed with patient; age-appropriate recommendations, preventive care, screening tests, etc discussed and encouraged; healthy living encouraged; see AVS for patient  education given to patient -Discussed importance of 150 minutes of physical activity weekly, eat two servings of fish weekly, eat one serving of tree nuts ( cashews, pistachios, pecans, almonds.Marland Kitchen) every other day, eat 6 servings of fruit/vegetables daily and drink plenty of water and avoid sweet beverages.

## 2020-05-14 NOTE — Patient Instructions (Signed)
Preventive Care 64 Years Old Old, Female Preventive care refers to visits with your health care provider and lifestyle choices that can promote health and wellness. This includes:  A yearly physical exam. This may also be called an annual well check.  Regular dental visits and eye exams.  Immunizations.  Screening for certain conditions.  Healthy lifestyle choices, such as eating a healthy diet, getting regular exercise, not using drugs or products that contain nicotine and tobacco, and limiting alcohol use. What can I expect for my preventive care visit? Physical exam Your health care provider will check your:  Height and weight. This may be used to calculate body mass index (BMI), which tells if you are at a healthy weight.  Heart rate and blood pressure.  Skin for abnormal spots. Counseling Your health care provider may ask you questions about your:  Alcohol, tobacco, and drug use.  Emotional well-being.  Home and relationship well-being.  Sexual activity.  Eating habits.  Work and work environment.  Method of birth control.  Menstrual cycle.  Pregnancy history. What immunizations do I need?  Influenza (flu) vaccine  This is recommended every year. Tetanus, diphtheria, and pertussis (Tdap) vaccine  You may need a Td booster every 10 years. Varicella (chickenpox) vaccine  You may need this if you have not been vaccinated. Zoster (shingles) vaccine  You may need this after age 63. Measles, mumps, and rubella (MMR) vaccine  You may need at least one dose of MMR if you were born in 1957 or later. You may also need a second dose. Pneumococcal conjugate (PCV13) vaccine  You may need this if you have certain conditions and were not previously vaccinated. Pneumococcal polysaccharide (PPSV23) vaccine  You may need one or two doses if you smoke cigarettes or if you have certain conditions. Meningococcal conjugate (MenACWY) vaccine  You may need this if you  have certain conditions. Hepatitis A vaccine  You may need this if you have certain conditions or if you travel or work in places where you may be exposed to hepatitis A. Hepatitis B vaccine  You may need this if you have certain conditions or if you travel or work in places where you may be exposed to hepatitis B. Haemophilus influenzae type b (Hib) vaccine  You may need this if you have certain conditions. Human papillomavirus (HPV) vaccine  If recommended by your health care provider, you may need three doses over 6 months. You may receive vaccines as individual doses or as more than one vaccine together in one shot (combination vaccines). Talk with your health care provider about the risks and benefits of combination vaccines. What tests do I need? Blood tests  Lipid and cholesterol levels. These may be checked every 5 years, or more frequently if you are over 50 years old.  Hepatitis C test.  Hepatitis B test. Screening  Lung cancer screening. You may have this screening every year starting at age 55 if you have a 30-pack-year history of smoking and currently smoke or have quit within the past 15 years.  Colorectal cancer screening. All adults should have this screening starting at age 50 and continuing until age 75. Your health care provider may recommend screening at age 45 if you are at increased risk. You will have tests every 1-10 years, depending on your results and the type of screening test.  Diabetes screening. This is done by checking your blood sugar (glucose) after you have not eaten for a while (fasting). You may have this   done every 1-3 years.  Mammogram. This may be done every 1-2 years. Talk with your health care provider about when you should start having regular mammograms. This may depend on whether you have a family history of breast cancer.  BRCA-related cancer screening. This may be done if you have a family history of breast, ovarian, tubal, or peritoneal  cancers.  Pelvic exam and Pap test. This may be done every 3 years starting at age 63. Starting at age 81, this may be done every 5 years if you have a Pap test in combination with an HPV test. Other tests  Sexually transmitted disease (STD) testing.  Bone density scan. This is done to screen for osteoporosis. You may have this scan if you are at high risk for osteoporosis. Follow these instructions at home: Eating and drinking  Eat a diet that includes fresh fruits and vegetables, whole grains, lean protein, and low-fat dairy.  Take vitamin and mineral supplements as recommended by your health care provider.  Do not drink alcohol if: ? Your health care provider tells you not to drink. ? You are pregnant, may be pregnant, or are planning to become pregnant.  If you drink alcohol: ? Limit how much you have to 0-1 drink a day. ? Be aware of how much alcohol is in your drink. In the [place] one drink equals one 12 oz bottle of beer (355 mL), one 5 oz glass of wine (148 mL), or one 1 oz glass of hard liquor (44 mL). Lifestyle  Take daily care of your teeth and gums.  Stay active. Exercise for at least 30 minutes on 5 or more days each week.  Do not use any products that contain nicotine or tobacco, such as cigarettes, e-cigarettes, and chewing tobacco. If you need help quitting, ask your health care provider.  If you are sexually active, practice safe sex. Use a condom or other form of birth control (contraception) in order to prevent pregnancy and STIs (sexually transmitted infections).  If told by your health care provider, take low-dose aspirin daily starting at age 62. What's next?  Visit your health care provider once a year for a well check visit.  Ask your health care provider how often you should have your eyes and teeth checked.  Stay up to date on all vaccines. This information is not intended to replace advice given to you by your health care provider. Make sure you  discuss any questions you have with your health care provider. Document Revised: 03/09/2018 Document Reviewed: 03/09/2018 Elsevier Patient Education  2020 [employer].

## 2020-05-15 ENCOUNTER — Other Ambulatory Visit: Payer: Self-pay

## 2020-05-15 ENCOUNTER — Encounter: Payer: Self-pay | Admitting: Family Medicine

## 2020-05-15 ENCOUNTER — Other Ambulatory Visit (HOSPITAL_COMMUNITY)
Admission: RE | Admit: 2020-05-15 | Discharge: 2020-05-15 | Disposition: A | Discharge status: Home / Self Care | Payer: 59 | Source: Ambulatory Visit | Attending: Family Medicine | Admitting: Family Medicine

## 2020-05-15 ENCOUNTER — Ambulatory Visit (INDEPENDENT_AMBULATORY_CARE_PROVIDER_SITE_OTHER): Payer: 59 | Admitting: Family Medicine

## 2020-05-15 VITALS — BP 112/76 | HR 89 | Temp 97.9°F | Resp 14 | Ht 62.0 in | Wt 108.6 lb

## 2020-05-15 DIAGNOSIS — Z0000 Encounter for general adult medical examination without abnormal findings: Secondary | ICD-10-CM

## 2020-05-15 DIAGNOSIS — Z23 Encounter for immunization: Secondary | ICD-10-CM | POA: Diagnosis not present

## 2020-05-15 DIAGNOSIS — K219 Gastro-esophageal reflux disease without esophagitis: Secondary | ICD-10-CM | POA: Diagnosis not present

## 2020-05-15 DIAGNOSIS — Z124 Encounter for screening for malignant neoplasm of cervix: Secondary | ICD-10-CM | POA: Insufficient documentation

## 2020-05-15 DIAGNOSIS — Z1231 Encounter for screening mammogram for malignant neoplasm of breast: Secondary | ICD-10-CM | POA: Diagnosis not present

## 2020-05-15 DIAGNOSIS — I700 Atherosclerosis of aorta: Secondary | ICD-10-CM | POA: Diagnosis not present

## 2020-05-15 DIAGNOSIS — J449 Chronic obstructive pulmonary disease, unspecified: Secondary | ICD-10-CM | POA: Insufficient documentation

## 2020-05-15 DIAGNOSIS — K5909 Other constipation: Secondary | ICD-10-CM

## 2020-05-15 DIAGNOSIS — R319 Hematuria, unspecified: Secondary | ICD-10-CM

## 2020-05-15 DIAGNOSIS — R9389 Abnormal findings on diagnostic imaging of other specified body structures: Secondary | ICD-10-CM

## 2020-05-16 LAB — URINALYSIS, MICROSCOPIC ONLY
Hyaline Cast: NONE SEEN /LPF
RBC / HPF: NONE SEEN /HPF (ref 0–2)

## 2020-05-16 LAB — LIPID PANEL
Cholesterol: 204 mg/dL — ABNORMAL HIGH (ref <200)
HDL: 67 mg/dL (ref >=50)
LDL Cholesterol (Calc): 119 mg/dL (calc) — ABNORMAL HIGH
Non-HDL Cholesterol (Calc): 137 mg/dL (calc) — ABNORMAL HIGH (ref <130)
Total CHOL/HDL Ratio: 3 (calc) (ref <5.0)
Triglycerides: 81 mg/dL (ref <150)

## 2020-05-16 LAB — CBC WITH DIFFERENTIAL/PLATELET
Absolute Monocytes: 427 cells/uL (ref 200–950)
Basophils Absolute: 40 cells/uL (ref 0–200)
Basophils Relative: 0.9 %
Eosinophils Absolute: 141 cells/uL (ref 15–500)
Eosinophils Relative: 3.2 %
HCT: 45.5 % — ABNORMAL HIGH (ref 35.0–45.0)
Hemoglobin: 15.4 g/dL (ref 11.7–15.5)
Lymphs Abs: 1698 cells/uL (ref 850–3900)
MCH: 31.4 pg (ref 27.0–33.0)
MCHC: 33.8 g/dL (ref 32.0–36.0)
MCV: 92.7 fL (ref 80.0–100.0)
MPV: 11.6 fL (ref 7.5–12.5)
Monocytes Relative: 9.7 %
Neutro Abs: 2094 cells/uL (ref 1500–7800)
Neutrophils Relative %: 47.6 %
Platelets: 245 10*3/uL (ref 140–400)
RBC: 4.91 10*6/uL (ref 3.80–5.10)
RDW: 12.1 % (ref 11.0–15.0)
Total Lymphocyte: 38.6 %
WBC: 4.4 10*3/uL (ref 3.8–10.8)

## 2020-05-16 LAB — COMPLETE METABOLIC PANEL WITH GFR
AG Ratio: 1.9 (calc) (ref 1.0–2.5)
ALT: 11 U/L (ref 6–29)
AST: 18 U/L (ref 10–35)
Albumin: 4.8 g/dL (ref 3.6–5.1)
Alkaline phosphatase (APISO): 68 U/L (ref 37–153)
BUN: 12 mg/dL (ref 7–25)
CO2: 27 mmol/L (ref 20–32)
Calcium: 10 mg/dL (ref 8.6–10.4)
Chloride: 101 mmol/L (ref 98–110)
Creat: 0.79 mg/dL (ref 0.50–0.99)
GFR, Est African American: 92 mL/min/{1.73_m2} (ref >=60)
GFR, Est Non African American: 80 mL/min/{1.73_m2} (ref >=60)
Globulin: 2.5 g/dL (calc) (ref 1.9–3.7)
Glucose, Bld: 93 mg/dL (ref 65–99)
Potassium: 3.9 mmol/L (ref 3.5–5.3)
Sodium: 138 mmol/L (ref 135–146)
Total Bilirubin: 0.8 mg/dL (ref 0.2–1.2)
Total Protein: 7.3 g/dL (ref 6.1–8.1)

## 2020-05-19 LAB — CYTOLOGY - PAP
Comment: NEGATIVE
Diagnosis: NEGATIVE
Diagnosis: REACTIVE
High risk HPV: NEGATIVE

## 2021-02-25 ENCOUNTER — Other Ambulatory Visit: Payer: Self-pay

## 2021-02-25 ENCOUNTER — Telehealth: Payer: Self-pay

## 2021-02-25 DIAGNOSIS — Z1231 Encounter for screening mammogram for malignant neoplasm of breast: Secondary | ICD-10-CM

## 2021-02-25 NOTE — Telephone Encounter (Signed)
Order placed, pt aware 

## 2021-02-25 NOTE — Telephone Encounter (Signed)
Pt annual physical was November of 2021. She was suppose to get her mammogram at the beginning of 2022 and forgot. Now she is requesting a new order to have it done.  Copied from [place] [phone]. Topic: General - Other >> Feb 25, 2021 11:49 AM Alanda Slim E wrote: Reason for CRM: pt would like an order /appt for a mammogram at [facility] / please advise

## 2021-03-25 ENCOUNTER — Ambulatory Visit
Admission: RE | Admit: 2021-03-25 | Discharge: 2021-03-25 | Disposition: A | Discharge status: Home / Self Care | Payer: 59 | Source: Ambulatory Visit | Attending: Family Medicine | Admitting: Family Medicine

## 2021-03-25 ENCOUNTER — Other Ambulatory Visit: Payer: Self-pay

## 2021-03-25 DIAGNOSIS — Z1231 Encounter for screening mammogram for malignant neoplasm of breast: Secondary | ICD-10-CM | POA: Insufficient documentation

## 2021-05-19 NOTE — Progress Notes (Signed)
Name: Wendy Ashley   MRN: [id number]    DOB: 10-26-1955   Date:05/20/2021       Progress Note  Subjective  Chief Complaint  Annual Exam  HPI  Patient presents for annual CPE and follow up  Abnormal CT chest: she saw Dr. Patsey Berthold many years ago, found to have granulomatous disease ( she has a remote history of TB around age 65 - she was treated in [place])  and also nodules , lost to follow up, CT from 2019 said on the bottom to repeat in 1 year. We will recheck it today   Atherosclerosis of aorta: found on CT chest 2018 , LDL above 100, she refuses statin therapy, discussed increase risk of heart attacks and strokes   GERD: very seldom has heartburn or indigestion and takes otc medication prn  Chronic constipation: she takes green tea every day to be able to have a bowel movement, and is [place], but only has a bowel movement after taking a green tea, not sure of the name - it has a laxative in it  Diet: cooks at home, lots of vegetables, chicken, fish, intolerant to calcium advised to look for high calcium options  Exercise: continue regular physical activity    [place] Office Visit from 05/15/2020 in [facility]  AUDIT-C Score 0      Depression: Phq 9 is  negative Depression screen [facility] 2/9 05/20/2021 05/15/2020 04/03/2019 05/09/2018 02/16/2017  Decreased Interest 0 0 0 0 0  Down, Depressed, Hopeless 0 0 0 0 0  PHQ - 2 Score 0 0 0 0 0  Altered sleeping 0 0 0 0 0  Tired, decreased energy 0 0 0 0 0  Change in appetite 0 0 0 0 0  Feeling bad or failure about yourself  0 0 0 0 0  Trouble concentrating 0 0 0 0 0  Moving slowly or fidgety/restless 0 0 0 0 0  Suicidal thoughts 0 0 0 0 0  PHQ-9 Score 0 0 0 0 0  Difficult doing work/chores - - Not difficult at all Not difficult at all -   Hypertension: BP Readings from Last 3 Encounters:  05/20/21 114/76  05/15/20 112/76  04/03/19 110/78   Obesity: Wt Readings from Last 3 Encounters:  05/20/21 112 lb (50.8  kg)  05/15/20 108 lb 9.6 oz (49.3 kg)  04/03/19 107 lb 4.8 oz (48.7 kg)   BMI Readings from Last 3 Encounters:  05/20/21 20.49 kg/m  05/15/20 19.86 kg/m  04/03/19 19.63 kg/m     Vaccines:   Shingrix: 52-64 yo and ask insurance if covered when patient above 11 yo Pneumonia: educated and discussed with patient. Flu: educated and discussed with patient.  Hep C Screening: 11/04/15 STD testing and prevention (HIV/chl/gon/syphilis): 11/04/15 Intimate partner violence: negative Sexual History : no pain or discomfort  Menstrual History/LMP/Abnormal Bleeding: menopause at age 42, discussed post-menopause bleeding  Incontinence Symptoms: no problems.   Breast cancer:  - Last Mammogram: 03/25/21 - BRCA gene screening: N/A  Osteoporosis: Discussed high calcium and vitamin D supplementation, weight bearing exercises  Cervical cancer screening: 05/15/20  Skin cancer: Discussed monitoring for atypical lesions  Colorectal cancer: 01/15/19   Lung cancer: Low Dose CT Chest recommended if Age 46-80 years, 20 pack-year currently smoking OR have quit w/in 15years. Patient does not qualify.  Getting CT for other reasons  ECG: today  Advanced Care Planning: A voluntary discussion about advance care planning including the explanation and discussion of  advance directives.  Discussed health care proxy and Living will, and the patient was able to identify a health care proxy as husband.  Patient does not have a living will at present time. If patient does have living will, I have requested they bring this to the clinic to be scanned in to their chart.  Lipids: Lab Results  Component Value Date   CHOL 204 (H) 05/15/2020   CHOL 205 (H) 04/03/2019   CHOL 226 (H) 05/09/2018   Lab Results  Component Value Date   HDL 67 05/15/2020   HDL 63 04/03/2019   HDL 62 05/09/2018   Lab Results  Component Value Date   LDLCALC 119 (H) 05/15/2020   LDLCALC 119 (H) 04/03/2019   LDLCALC 142 (H) 05/09/2018    Lab Results  Component Value Date   TRIG 81 05/15/2020   TRIG 120 04/03/2019   TRIG 104 05/09/2018   Lab Results  Component Value Date   CHOLHDL 3.0 05/15/2020   CHOLHDL 3.3 04/03/2019   CHOLHDL 3.6 05/09/2018   No results found for: LDLDIRECT  Glucose: Glucose  Date Value Ref Range Status  06/04/2013 101 (H) 65 - 99 mg/dL Final   Glucose, Bld  Date Value Ref Range Status  05/15/2020 93 65 - 99 mg/dL Final    Comment:    .            Fasting reference interval .   04/03/2019 92 65 - 99 mg/dL Final    Comment:    .            Fasting reference interval .   05/09/2018 92 65 - 139 mg/dL Final    Comment:    .        Non-fasting reference interval .     Patient Active Problem List   Diagnosis Date Noted   Atherosclerosis of aorta ([place]) 05/15/2020   Hip pain, chronic, left 02/16/2017   Personal history of colonic polyps    Benign neoplasm of transverse colon    Lung nodule < 6cm on CT 08/19/2015   Chronic constipation 01/22/2015   GERD (gastroesophageal reflux disease) 01/22/2015   Osteopenia 01/22/2015    Past Surgical History:  Procedure Laterality Date   COLONOSCOPY WITH PROPOFOL N/A 12/22/2015   Procedure: COLONOSCOPY WITH PROPOFOL;  Surgeon: [name], MD;  Location: [place];  Service: Endoscopy;  Laterality: N/A;   COLONOSCOPY WITH PROPOFOL N/A 01/15/2019   Procedure: COLONOSCOPY WITH PROPOFOL;  Surgeon: [name], MD;  Location: [place];  Service: Endoscopy;  Laterality: N/A;   CYSTOSCOPY WITH BIOPSY N/A 09/17/2015   Procedure: CYSTOSCOPY WITH BIOPSY;  Surgeon: Nickie Retort, MD;  Location: [facility];  Service: Urology;  Laterality: N/A;   CYSTOSCOPY WITH FULGERATION N/A 09/17/2015   Procedure: CYSTOSCOPY WITH FULGERATION;  Surgeon: Nickie Retort, MD;  Location: [facility];  Service: Urology;  Laterality: N/A;   POLYPECTOMY  12/22/2015   Procedure: POLYPECTOMY;  Surgeon: [name], MD;  Location: [place];   Service: Endoscopy;;   TUBAL LIGATION      Family History  Problem Relation Age of Onset   Hypertension Mother    Alzheimer's disease Father    Cancer Father        colon   Hypertension Sister    Hypertension Brother    Hypertension Sister    Breast cancer Neg Hx     Social History   Socioeconomic History   Marital status: Married  Spouse name: Anne Hahn   Number of children: 3   Years of education: Not on file   Highest education level: Bachelor's degree (e.g., BA, AB, BS)  Occupational History   Occupation: unemployed  Tobacco Use   Smoking status: Never   Smokeless tobacco: Never  Vaping Use   Vaping Use: Never used  Substance and Sexual Activity   Alcohol use: Yes    Alcohol/week: 2.0 standard drinks    Types: 2 Glasses of wine per week    Comment: on occasion   Drug use: No   Sexual activity: Yes    Partners: Male  Other Topics Concern   Not on file  Social History Narrative   Originally from [place], moved to [place] with husband from up [place].    Social Determinants of Health   Financial Resource Strain: Low Risk    Difficulty of Paying Living Expenses: Not hard at all  Food Insecurity: No Food Insecurity   Worried About Charity fundraiser in the Last Year: Never true   [place] in the Last Year: Never true  Transportation Needs: No Transportation Needs   Lack of Transportation (Medical): No   Lack of Transportation (Non-Medical): No  Physical Activity: Sufficiently Active   Days of Exercise per Week: 3 days   Minutes of Exercise per Session: 50 min  Stress: No Stress Concern Present   Feeling of Stress : Not at all  Social Connections: Moderately Integrated   Frequency of Communication with Friends and Family: More than three times a week   Frequency of Social Gatherings with Friends and Family: Once a week   Attends Religious Services: More than 4 times per year   Active Member of [employer] or Organizations: No   Attends Archivist  Meetings: Never   Marital Status: Married  Human resources officer Violence: Not At Risk   Fear of Current or Ex-Partner: No   Emotionally Abused: No   Physically Abused: No   Sexually Abused: No     Current Outpatient Medications:    Multiple Vitamin (MULTIVITAMIN) tablet, Take 1 tablet by mouth daily., Disp: , Rfl:   Allergies  Allergen Reactions   Tylenol [Acetaminophen] Swelling    Lips   Ciprofloxacin Swelling and Rash    LIPS AND THROAT     ROS  Constitutional: Negative for fever or weight change.  Respiratory: Negative for cough and shortness of breath.   Cardiovascular: Negative for chest pain or palpitations.  Gastrointestinal: Negative for abdominal pain, no bowel changes.  Musculoskeletal: Negative for gait problem or joint swelling.  Skin: Negative for rash.  Neurological: Negative for dizziness or headache.  No other specific complaints in a complete review of systems (except as listed in HPI above).   Objective  Vitals:   05/20/21 0747  BP: 114/76  Pulse: 81  Resp: 16  Temp: 97.8 F (36.6 C)  SpO2: 98%  Weight: 112 lb (50.8 kg)  Height: 5' 2"  (1.575 m)    Body mass index is 20.49 kg/m.  Physical Exam  Constitutional: Patient appears well-developed and well-nourished. No distress.  HENT: Head: Normocephalic and atraumatic. Ears: B TMs ok, no erythema or effusion; Nose: Not done Mouth/Throat: not done  Eyes: Conjunctivae and EOM are normal. Pupils are equal, round, and reactive to light. No scleral icterus.  Neck: Normal range of motion. Neck supple. No JVD present. No thyromegaly present.  Cardiovascular: Normal rate, regular rhythm and normal heart sounds.  No murmur heard. No BLE  edema. Pulmonary/Chest: Effort normal and breath sounds normal. No respiratory distress. Abdominal: Soft. Bowel sounds are normal, no distension. There is no tenderness. no masses Breast: no lumps or masses, no nipple discharge or rashes FEMALE GENITALIA:  Not done   RECTAL: not done  Musculoskeletal: Normal range of motion, no joint effusions. No gross deformities Neurological: he is alert and oriented to person, place, and time. No cranial nerve deficit. Coordination, balance, strength, speech and gait are normal.  Skin: Skin is warm and dry. No rash noted. No erythema.  Psychiatric: Patient has a normal mood and affect. behavior is normal. Judgment and thought content normal.   Fall Risk: Fall Risk  05/20/2021 05/15/2020 04/03/2019 05/09/2018 02/16/2017  Falls in the past year? 0 0 1 No No  Number falls in past yr: 0 0 0 - -  Injury with Fall? 0 0 0 - -  Risk for fall due to : No Fall Risks - - - -  Follow up Falls prevention discussed - - - -     Functional Status Survey: Is the patient deaf or have difficulty hearing?: No Does the patient have difficulty seeing, even when wearing glasses/contacts?: No Does the patient have difficulty concentrating, remembering, or making decisions?: No Does the patient have difficulty walking or climbing stairs?: No Does the patient have difficulty dressing or bathing?: No Does the patient have difficulty doing errands alone such as visiting a doctor's office or shopping?: No   Assessment & Plan  1. Well adult exam  - EKG 12-Lead - VITAMIN D 25 Hydroxy (Vit-D Deficiency, Fractures) - CBC with Differential/Platelet - COMPLETE METABOLIC PANEL WITH GFR - Lipid panel - Hemoglobin A1c  2. Needs flu shot  refused  3. Need for shingles vaccine  - Varicella-zoster vaccine IM  4. Breast cancer screening by mammogram  Up to date   5. Atherosclerosis of aorta (H[facility])  - EKG 12-Lead - poor R wave progression  - Lipid panel  6. Gastroesophageal reflux disease without esophagitis   7. Abnormal CT of the chest  - CT CHEST NODULE FOLLOW UP LOW DOSE W/O; Future  8. Chronic constipation   9. Abnormal CBC  - CBC with Differential/Platelet  10. Osteopenia after menopause  - DG Bone Density;  Future  11. Vitamin D deficiency  - VITAMIN D 25 Hydroxy (Vit-D Deficiency, Fractures)  12. Diabetes mellitus screening  - Hemoglobin A1c  13. Personal history of tuberculosis  - CT CHEST NODULE FOLLOW UP LOW DOSE W/O; Future   -USPSTF grade A and B recommendations reviewed with patient; age-appropriate recommendations, preventive care, screening tests, etc discussed and encouraged; healthy living encouraged; see AVS for patient education given to patient -Discussed importance of 150 minutes of physical activity weekly, eat two servings of fish weekly, eat one serving of tree nuts ( cashews, pistachios, pecans, almonds.Marland Kitchen) every other day, eat 6 servings of fruit/vegetables daily and drink plenty of water and avoid sweet beverages.

## 2021-05-20 ENCOUNTER — Other Ambulatory Visit: Payer: Self-pay

## 2021-05-20 ENCOUNTER — Encounter: Payer: Self-pay | Admitting: Family Medicine

## 2021-05-20 ENCOUNTER — Ambulatory Visit (INDEPENDENT_AMBULATORY_CARE_PROVIDER_SITE_OTHER): Payer: 59 | Admitting: Family Medicine

## 2021-05-20 VITALS — BP 114/76 | HR 81 | Temp 97.8°F | Resp 16 | Ht 62.0 in | Wt 112.0 lb

## 2021-05-20 DIAGNOSIS — Z0000 Encounter for general adult medical examination without abnormal findings: Secondary | ICD-10-CM | POA: Diagnosis not present

## 2021-05-20 DIAGNOSIS — Z780 Asymptomatic menopausal state: Secondary | ICD-10-CM

## 2021-05-20 DIAGNOSIS — K5909 Other constipation: Secondary | ICD-10-CM

## 2021-05-20 DIAGNOSIS — Z8611 Personal history of tuberculosis: Secondary | ICD-10-CM

## 2021-05-20 DIAGNOSIS — E559 Vitamin D deficiency, unspecified: Secondary | ICD-10-CM

## 2021-05-20 DIAGNOSIS — Z23 Encounter for immunization: Secondary | ICD-10-CM

## 2021-05-20 DIAGNOSIS — R9389 Abnormal findings on diagnostic imaging of other specified body structures: Secondary | ICD-10-CM | POA: Diagnosis not present

## 2021-05-20 DIAGNOSIS — M8580 Other specified disorders of bone density and structure, unspecified site: Secondary | ICD-10-CM

## 2021-05-20 DIAGNOSIS — I700 Atherosclerosis of aorta: Secondary | ICD-10-CM | POA: Diagnosis not present

## 2021-05-20 DIAGNOSIS — Z1231 Encounter for screening mammogram for malignant neoplasm of breast: Secondary | ICD-10-CM | POA: Diagnosis not present

## 2021-05-20 DIAGNOSIS — R7989 Other specified abnormal findings of blood chemistry: Secondary | ICD-10-CM

## 2021-05-20 DIAGNOSIS — K219 Gastro-esophageal reflux disease without esophagitis: Secondary | ICD-10-CM

## 2021-05-20 DIAGNOSIS — Z131 Encounter for screening for diabetes mellitus: Secondary | ICD-10-CM

## 2021-05-21 LAB — CBC WITH DIFFERENTIAL/PLATELET
Absolute Monocytes: 449 cells/uL (ref 200–950)
Basophils Absolute: 31 cells/uL (ref 0–200)
Basophils Relative: 0.7 %
Eosinophils Absolute: 220 cells/uL (ref 15–500)
Eosinophils Relative: 5 %
HCT: 43.3 % (ref 35.0–45.0)
Hemoglobin: 14.9 g/dL (ref 11.7–15.5)
Lymphs Abs: 1531 cells/uL (ref 850–3900)
MCH: 31.4 pg (ref 27.0–33.0)
MCHC: 34.4 g/dL (ref 32.0–36.0)
MCV: 91.4 fL (ref 80.0–100.0)
MPV: 10.9 fL (ref 7.5–12.5)
Monocytes Relative: 10.2 %
Neutro Abs: 2169 cells/uL (ref 1500–7800)
Neutrophils Relative %: 49.3 %
Platelets: 236 10*3/uL (ref 140–400)
RBC: 4.74 10*6/uL (ref 3.80–5.10)
RDW: 11.8 % (ref 11.0–15.0)
Total Lymphocyte: 34.8 %
WBC: 4.4 10*3/uL (ref 3.8–10.8)

## 2021-05-21 LAB — COMPLETE METABOLIC PANEL WITH GFR
AG Ratio: 2.1 (calc) (ref 1.0–2.5)
ALT: 15 U/L (ref 6–29)
AST: 19 U/L (ref 10–35)
Albumin: 4.8 g/dL (ref 3.6–5.1)
Alkaline phosphatase (APISO): 69 U/L (ref 37–153)
BUN: 13 mg/dL (ref 7–25)
CO2: 24 mmol/L (ref 20–32)
Calcium: 9.6 mg/dL (ref 8.6–10.4)
Chloride: 104 mmol/L (ref 98–110)
Creat: 0.66 mg/dL (ref 0.50–1.05)
Globulin: 2.3 g/dL (calc) (ref 1.9–3.7)
Glucose, Bld: 89 mg/dL (ref 65–99)
Potassium: 3.9 mmol/L (ref 3.5–5.3)
Sodium: 141 mmol/L (ref 135–146)
Total Bilirubin: 0.6 mg/dL (ref 0.2–1.2)
Total Protein: 7.1 g/dL (ref 6.1–8.1)
eGFR: 98 mL/min/{1.73_m2} (ref >=60)

## 2021-05-21 LAB — HEMOGLOBIN A1C
Hgb A1c MFr Bld: 5.2 % of total Hgb (ref <5.7)
Mean Plasma Glucose: 103 mg/dL
eAG (mmol/L): 5.7 mmol/L

## 2021-05-21 LAB — LIPID PANEL
Cholesterol: 222 mg/dL — ABNORMAL HIGH (ref <200)
HDL: 71 mg/dL (ref >=50)
LDL Cholesterol (Calc): 135 mg/dL (calc) — ABNORMAL HIGH
Non-HDL Cholesterol (Calc): 151 mg/dL (calc) — ABNORMAL HIGH (ref <130)
Total CHOL/HDL Ratio: 3.1 (calc) (ref <5.0)
Triglycerides: 67 mg/dL (ref <150)

## 2021-05-21 LAB — VITAMIN D 25 HYDROXY (VIT D DEFICIENCY, FRACTURES): Vit D, 25-Hydroxy: 40 ng/mL (ref 30–100)

## 2021-06-09 ENCOUNTER — Ambulatory Visit
Admission: RE | Admit: 2021-06-09 | Discharge: 2021-06-09 | Disposition: A | Discharge status: Home / Self Care | Payer: 59 | Source: Ambulatory Visit | Attending: Family Medicine | Admitting: Family Medicine

## 2021-06-09 ENCOUNTER — Encounter: Payer: Self-pay | Admitting: Family Medicine

## 2021-06-09 ENCOUNTER — Other Ambulatory Visit: Payer: Self-pay

## 2021-06-09 DIAGNOSIS — Z780 Asymptomatic menopausal state: Secondary | ICD-10-CM | POA: Insufficient documentation

## 2021-06-09 DIAGNOSIS — M8580 Other specified disorders of bone density and structure, unspecified site: Secondary | ICD-10-CM | POA: Diagnosis present

## 2021-06-22 ENCOUNTER — Telehealth: Payer: Self-pay

## 2021-06-22 NOTE — Telephone Encounter (Signed)
Copied from [place] [phone]. Topic: General - Other >> Jun 19, 2021  3:42 PM McGill, Nelva Bush wrote: Reason for CRM: Lorenda Cahill. from [facility] health insurance phone number [phone] stated CT for thorax procedure code should be sent to [facility]. Stated this will not be reviewed by [employer].  [facility] phone number [phone]

## 2021-11-05 ENCOUNTER — Ambulatory Visit: Payer: No Typology Code available for payment source | Admitting: Physician Assistant

## 2021-11-06 ENCOUNTER — Telehealth: Payer: Self-pay

## 2021-11-06 ENCOUNTER — Other Ambulatory Visit
Admission: RE | Admit: 2021-11-06 | Discharge: 2021-11-06 | Disposition: A | Discharge status: Home / Self Care | Payer: No Typology Code available for payment source | Source: Ambulatory Visit | Attending: Family Medicine | Admitting: Family Medicine

## 2021-11-06 ENCOUNTER — Encounter: Payer: Self-pay | Admitting: Family Medicine

## 2021-11-06 ENCOUNTER — Other Ambulatory Visit: Payer: Self-pay | Admitting: Family Medicine

## 2021-11-06 ENCOUNTER — Ambulatory Visit (INDEPENDENT_AMBULATORY_CARE_PROVIDER_SITE_OTHER): Payer: No Typology Code available for payment source | Admitting: Family Medicine

## 2021-11-06 VITALS — BP 138/90 | HR 90 | Temp 97.9°F | Resp 14 | Ht 62.0 in | Wt 116.4 lb

## 2021-11-06 DIAGNOSIS — I700 Atherosclerosis of aorta: Secondary | ICD-10-CM | POA: Diagnosis not present

## 2021-11-06 DIAGNOSIS — R079 Chest pain, unspecified: Secondary | ICD-10-CM | POA: Insufficient documentation

## 2021-11-06 DIAGNOSIS — I10 Essential (primary) hypertension: Secondary | ICD-10-CM | POA: Diagnosis not present

## 2021-11-06 DIAGNOSIS — R42 Dizziness and giddiness: Secondary | ICD-10-CM | POA: Diagnosis not present

## 2021-11-06 LAB — CBC WITH DIFFERENTIAL/PLATELET
Abs Immature Granulocytes: 0.01 10*3/uL (ref 0.00–0.07)
Basophils Absolute: 0 10*3/uL (ref 0.0–0.1)
Basophils Relative: 1 %
Eosinophils Absolute: 0.2 10*3/uL (ref 0.0–0.5)
Eosinophils Relative: 3 %
HCT: 41.3 % (ref 36.0–46.0)
Hemoglobin: 14.1 g/dL (ref 12.0–15.0)
Immature Granulocytes: 0 %
Lymphocytes Relative: 25 %
Lymphs Abs: 1.3 10*3/uL (ref 0.7–4.0)
MCH: 31 pg (ref 26.0–34.0)
MCHC: 34.1 g/dL (ref 30.0–36.0)
MCV: 90.8 fL (ref 80.0–100.0)
Monocytes Absolute: 0.5 10*3/uL (ref 0.1–1.0)
Monocytes Relative: 9 %
Neutro Abs: 3.4 10*3/uL (ref 1.7–7.7)
Neutrophils Relative %: 62 %
Platelets: 235 10*3/uL (ref 150–400)
RBC: 4.55 MIL/uL (ref 3.87–5.11)
RDW: 12.3 % (ref 11.5–15.5)
WBC: 5.4 10*3/uL (ref 4.0–10.5)
nRBC: 0 % (ref 0.0–0.2)

## 2021-11-06 LAB — COMPREHENSIVE METABOLIC PANEL
ALT: 19 U/L (ref 0–44)
AST: 22 U/L (ref 15–41)
Albumin: 4.4 g/dL (ref 3.5–5.0)
Alkaline Phosphatase: 65 U/L (ref 38–126)
Anion gap: 7 (ref 5–15)
BUN: 12 mg/dL (ref 8–23)
CO2: 26 mmol/L (ref 22–32)
Calcium: 9.5 mg/dL (ref 8.9–10.3)
Chloride: 103 mmol/L (ref 98–111)
Creatinine, Ser: 0.81 mg/dL (ref 0.44–1.00)
GFR, Estimated: >60 mL/min (ref >60)
Glucose, Bld: 100 mg/dL — ABNORMAL HIGH (ref 70–99)
Potassium: 3.8 mmol/L (ref 3.5–5.1)
Sodium: 136 mmol/L (ref 135–145)
Total Bilirubin: 0.9 mg/dL (ref 0.3–1.2)
Total Protein: 7 g/dL (ref 6.5–8.1)

## 2021-11-06 LAB — TROPONIN I (HIGH SENSITIVITY): Troponin I (High Sensitivity): 2 ng/L (ref <18)

## 2021-11-06 LAB — TSH: TSH: 1.653 u[IU]/mL (ref 0.350–4.500)

## 2021-11-06 LAB — CK TOTAL AND CKMB (NOT AT ARMC)
CK, MB: 0.8 ng/mL (ref 0.5–5.0)
Relative Index: INVALID (ref 0.0–2.5)
Total CK: 87 U/L (ref 38–234)

## 2021-11-06 MED ORDER — HYDROCHLOROTHIAZIDE 12.5 MG PO TABS: 12.5000 mg | ORAL_TABLET | Freq: Every day | ORAL | 0 refills | Status: DC

## 2021-11-06 NOTE — Progress Notes (Signed)
Name: Wendy Ashley   MRN: [id number]    DOB: 1956/03/10   Date:11/06/2021 ? ?     Progress Note ? ?Subjective ? ?Chief Complaint ? ?Acute visit for hypertension concerns accompanied by dizziness and headaches ? ?HPI ? ?Chest pain and spiked bp: symptoms started suddenly three days ago , initially felt flushed followed by left side chest discomfort that happens at rest or activity. She also has noticed some dizziness ( lightheaded ) she check bp at home and has been between 164-180/85-92. She did not go to [facility] , discussed symptoms of heart attack and strokes and the need to call 911 . Currently she has mild pain is 4/10, no nausea, vomiting . This morning she had one episode of diaphoresis. ? ?No significant family history of heart disease, denies change in stress level.  ? ?Patient Active Problem List  ? Diagnosis Date Noted  ? Atherosclerosis of aorta ([place]) 05/15/2020  ? Hip pain, chronic, left 02/16/2017  ? Personal history of colonic polyps   ? Benign neoplasm of transverse colon   ? Lung nodule < 6cm on CT 08/19/2015  ? Chronic constipation 01/22/2015  ? GERD (gastroesophageal reflux disease) 01/22/2015  ? Osteopenia 01/22/2015  ? ? ?Past Surgical History:  ?Procedure Laterality Date  ? COLONOSCOPY WITH PROPOFOL N/A 12/22/2015  ? Procedure: COLONOSCOPY WITH PROPOFOL;  Surgeon: [name], MD;  Location: [place];  Service: Endoscopy;  Laterality: N/A;  ? COLONOSCOPY WITH PROPOFOL N/A 01/15/2019  ? Procedure: COLONOSCOPY WITH PROPOFOL;  Surgeon: [name], MD;  Location: [place];  Service: Endoscopy;  Laterality: N/A;  ? CYSTOSCOPY WITH BIOPSY N/A 09/17/2015  ? Procedure: CYSTOSCOPY WITH BIOPSY;  Surgeon: Nickie Retort, MD;  Location: [facility];  Service: Urology;  Laterality: N/A;  ? CYSTOSCOPY WITH FULGERATION N/A 09/17/2015  ? Procedure: CYSTOSCOPY WITH FULGERATION;  Surgeon: Nickie Retort, MD;  Location: [facility];  Service: Urology;  Laterality: N/A;  ? POLYPECTOMY  12/22/2015  ?  Procedure: POLYPECTOMY;  Surgeon: [name], MD;  Location: [place];  Service: Endoscopy;;  ? TUBAL LIGATION    ? ? ?Family History  ?Problem Relation Age of Onset  ? Hypertension Mother   ? Alzheimer's disease Father   ? Cancer Father   ?     colon  ? Hypertension Sister   ? Hypertension Brother   ? Hypertension Sister   ? Breast cancer Neg Hx   ? ? ?Social History  ? ?Tobacco Use  ? Smoking status: Never  ? Smokeless tobacco: Never  ?Substance Use Topics  ? Alcohol use: Yes  ?  Alcohol/week: 2.0 standard drinks  ?  Types: 2 Glasses of wine per week  ?  Comment: on occasion  ? ? ? ?Current Outpatient Medications:  ?  Cholecalciferol (VITAMIN D) 50 MCG (2000 UT) CAPS, Take 1 capsule by mouth daily at 12 noon., Disp: , Rfl:  ?  Multiple Vitamin (MULTIVITAMIN) tablet, Take 1 tablet by mouth daily., Disp: , Rfl:  ? ?Allergies  ?Allergen Reactions  ? Tylenol [Acetaminophen] Swelling  ?  Lips  ? Ciprofloxacin Swelling and Rash  ?  LIPS AND THROAT  ? ? ?I personally reviewed active problem list, medication list, allergies, family history, social history with the patient/caregiver today. ? ? ?ROS ? ?Ten systems reviewed and is negative except as mentioned in HPI  ? ?Objective ? ?Vitals:  ? 11/06/21 1128 11/06/21 1205  ?BP: (!) 170/92 138/90  ?Pulse: 90   ?Resp:  14   ?Temp: 97.9 ?F (36.6 ?C)   ?TempSrc: Oral   ?SpO2: 98%   ?Weight: 116 lb 6.4 oz (52.8 kg)   ?Height: '5\' 2"'$  (1.575 m)   ? ? ?Body mass index is 21.29 kg/m?. ? ?Physical Exam ? ?Constitutional: Patient appears well-developed and well-nourished.  No distress.  ?HEENT: head atraumatic, normocephalic, pupils equal and reactive to light,  neck supple ?Cardiovascular: Normal rate, regular rhythm and normal heart sounds.  No murmur heard. No BLE edema. ?Pulmonary/Chest: Effort normal and breath sounds normal. No respiratory distress. ?Abdominal: Soft.  There is no tenderness. ?Psychiatric: Patient has a normal mood and affect. behavior is normal. Judgment  and thought content normal.  ? ?PHQ2/9: ? ?  11/06/2021  ? 11:29 AM 05/20/2021  ?  7:36 AM 05/15/2020  ?  9:02 AM 04/03/2019  ?  8:24 AM 05/09/2018  ?  9:36 AM  ?Depression screen PHQ 2/9  ?Decreased Interest 0 0 0 0 0  ?Down, Depressed, Hopeless 0 0 0 0 0  ?PHQ - 2 Score 0 0 0 0 0  ?Altered sleeping 0 0 0 0 0  ?Tired, decreased energy 0 0 0 0 0  ?Change in appetite 0 0 0 0 0  ?Feeling bad or failure about yourself  0 0 0 0 0  ?Trouble concentrating 0 0 0 0 0  ?Moving slowly or fidgety/restless 0 0 0 0 0  ?Suicidal thoughts 0 0 0 0 0  ?PHQ-9 Score 0 0 0 0 0  ?Difficult doing work/chores    Not difficult at all Not difficult at all  ?  ?phq 9 is negative ? ? ?Fall Risk: ? ?  11/06/2021  ? 11:29 AM 05/20/2021  ?  7:36 AM 05/15/2020  ?  9:02 AM 04/03/2019  ?  8:23 AM 05/09/2018  ?  9:36 AM  ?Fall Risk   ?Falls in the past year? 0 0 0 1 No  ?Number falls in past yr:  0 0 0   ?Injury with Fall?  0 0 0   ?Risk for fall due to : No Fall Risks No Fall Risks     ?Follow up Falls prevention discussed Falls prevention discussed     ? ? ? ?Functional Status Survey: ?Is the patient deaf or have difficulty hearing?: No ?Does the patient have difficulty seeing, even when wearing glasses/contacts?: No ?Does the patient have difficulty concentrating, remembering, or making decisions?: No ?Does the patient have difficulty walking or climbing stairs?: No ?Does the patient have difficulty dressing or bathing?: No ?Does the patient have difficulty doing errands alone such as visiting a doctor's office or shopping?: No ? ? ? ?Assessment & Plan ? ?1. Atherosclerosis of aorta ([place]) ? ?Not on statin therapy  ? ?2. Dizziness ? ?Likely from spike in bp  ? ?3. Hypertension, unspecified type ? ?New onset, we will send her to [facility] to rule out angina, MI ?- Microalbumin / creatinine urine ratio ?- Comprehensive metabolic panel; Future ?- TSH; Future ?- CBC with Differential/Platelet; Future ? ?If negative enzymes we will send rx for HCTZ ? ?4. Chest pain  in adult ? ?- EKG 12-Lead no Qwave , normal SR ?- CK total and CKMB (cardiac)not at [facility]; Future ?- Troponin I (High Sensitivity); Future ?- Comprehensive metabolic panel; Future ?- TSH; Future ?- CBC with Differential/Platelet; Future  ? ?Advised to stay at [facility] until we call her back with results ? ?

## 2021-11-06 NOTE — Telephone Encounter (Signed)
Copied from Wendy Ashley [phone]. Topic: General - Other ?>> Nov 06, 2021  2:47 PM Wendy Ashley wrote: ?Reason for CRM: Pt stated she went to the hospital as instructed and she is waiting for a call from Dr. Ancil Boozer because she was told not to leave the hospital until she spoke with Dr. Ancil Boozer. ?

## 2021-11-07 LAB — MICROALBUMIN / CREATININE URINE RATIO
Creatinine, Urine: 25.8 mg/dL
Microalb Creat Ratio: 16 mg/g creat (ref 0–29)
Microalb, Ur: 4.2 ug/mL — ABNORMAL HIGH

## 2021-11-12 ENCOUNTER — Ambulatory Visit: Payer: Self-pay | Admitting: *Deleted

## 2021-11-12 ENCOUNTER — Encounter: Payer: Self-pay | Admitting: Family Medicine

## 2021-11-12 ENCOUNTER — Ambulatory Visit (INDEPENDENT_AMBULATORY_CARE_PROVIDER_SITE_OTHER): Payer: No Typology Code available for payment source | Admitting: Family Medicine

## 2021-11-12 VITALS — BP 162/78 | HR 85 | Resp 16 | Ht 62.0 in | Wt 116.0 lb

## 2021-11-12 DIAGNOSIS — R079 Chest pain, unspecified: Secondary | ICD-10-CM | POA: Diagnosis not present

## 2021-11-12 DIAGNOSIS — R519 Headache, unspecified: Secondary | ICD-10-CM | POA: Diagnosis not present

## 2021-11-12 DIAGNOSIS — T887XXA Unspecified adverse effect of drug or medicament, initial encounter: Secondary | ICD-10-CM | POA: Diagnosis not present

## 2021-11-12 DIAGNOSIS — I700 Atherosclerosis of aorta: Secondary | ICD-10-CM | POA: Diagnosis not present

## 2021-11-12 DIAGNOSIS — R42 Dizziness and giddiness: Secondary | ICD-10-CM

## 2021-11-12 DIAGNOSIS — I10 Essential (primary) hypertension: Secondary | ICD-10-CM | POA: Diagnosis not present

## 2021-11-12 MED ORDER — AMLODIPINE BESYLATE 10 MG PO TABS: ORAL_TABLET | ORAL | 0 refills | Status: DC

## 2021-11-12 NOTE — Telephone Encounter (Signed)
?  Chief Complaint: Dizziness ?Symptoms: Lightheadedness, intermittent headache, nausea, no vomiting,tingling legs at times. States onset 1 day after taking new med HCTZ 11/07/21. BP this AM prior to taking med(0900) 148/71  during call (1015) 179/96  HR 81. ?Frequency: 11/07/21 ?Pertinent Negatives: Patient denies Spinning, vomiting ?Disposition: '[]'$ ED /'[]'$ Urgent Care (no appt availability in office) / '[x]'$ Appointment(In office/virtual)/ '[]'$  [place] [facility]/ '[]'$ Home Care/ '[]'$ Refused Recommended Disposition /'[]'$ [facility]/ '[]'$  Follow-up with PCP ?Additional Notes: Appt secured for today. Husband will drive.CAre advise per protocol provided.  Advised ED for worsening symptoms. Pt verbalizes understanding. ?Reason for Disposition ? [1] MODERATE dizziness (e.g., interferes with normal activities) AND [2] has NOT been evaluated by physician for this  (Exception: dizziness caused by heat exposure, sudden standing, or poor fluid intake) ? ?Answer Assessment - Initial Assessment Questions ?1. DESCRIPTION: "Describe your dizziness." ?    Lightheaded ?2. LIGHTHEADED: "Do you feel lightheaded?" (e.g., somewhat faint, woozy, weak upon standing) ?    yes ?3. VERTIGO: "Do you feel like either you or the room is spinning or tilting?" (i.e. vertigo) ?    no ?4. SEVERITY: "How bad is it?"  "Do you feel like you are going to faint?" "Can you stand and walk?" ?  - MILD: Feels slightly dizzy, but walking normally. ?  - MODERATE: Feels unsteady when walking, but not falling; interferes with normal activities (e.g., school, work). ?  - SEVERE: Unable to walk without falling, or requires assistance to walk without falling; feels like passing out now.  ?    Moderate, nausea ?5. ONSET:  "When did the dizziness begin?" ?    11/08/21 ?6. AGGRAVATING FACTORS: "Does anything make it worse?" (e.g., standing, change in head position) ?     ?7. HEART RATE: "Can you tell me your heart rate?" "How many beats in 15 seconds?"  (Note: not all  patients can do this)   ?    81 ?8. CAUSE: "What do you think is causing the dizziness?" ?     ?9. RECURRENT SYMPTOM: "Have you had dizziness before?" If Yes, ask: "When was the last time?" "What happened that time?" ?     ?10. OTHER SYMPTOMS: "Do you have any other symptoms?" (e.g., fever, chest pain, vomiting, diarrhea, bleeding) ?      Nausea,headache 6/10 Comes and goes ? ?Protocols used: Dizziness - Lightheadedness-A-AH ? ?

## 2021-11-12 NOTE — Progress Notes (Signed)
? ? ?Patient ID: Wendy Ashley, female    DOB: [birth year], 66 y.o.   MRN: [id number] ? ?PCP: Steele Sizer, MD ? ?Chief Complaint  ?Patient presents with  ? Dizziness  ? Headache  ? Nausea  ? ? ?Subjective:  ? ?Wendy Ashley is a 66 y.o. female, presents to clinic with CC of the following: ? ?Headache  ?This is a new problem. The current episode started in the past 7 days. The problem occurs intermittently. The problem has been unchanged. Pain location: all over. The pain does not radiate. The quality of the pain is described as pulsating and throbbing (like someone hit her on the head, comes and goes). The pain is moderate. Associated symptoms include dizziness, nausea and tingling. Pertinent negatives include no abdominal pain, abnormal behavior, anorexia, back pain, blurred vision, coughing, drainage, ear pain, eye pain, eye redness, eye watering, facial sweating, fever, insomnia, loss of balance, muscle aches, neck pain, numbness, phonophobia, photophobia, rhinorrhea, scalp tenderness, seizures, sinus pressure, sore throat, swollen glands, tinnitus, visual change, vomiting, weakness or weight loss. Nothing aggravates the symptoms. She has tried nothing for the symptoms.  ?Dizziness ?This is a new problem. The current episode started in the past 7 days (Started same time as headaches when starting new blood pressure medication). Associated symptoms include headaches and nausea. Pertinent negatives include no abdominal pain, anorexia, arthralgias, change in bowel habit, chest pain, chills, congestion, coughing, diaphoresis, fatigue, fever, joint swelling, myalgias, neck pain, numbness, sore throat, swollen glands, urinary symptoms, vertigo, visual change, vomiting or weakness.   ? ?Pt with HA, dizziness, nausea ?Started 3-4 days ago ?Having new HTN and med was started - HCTZ just last week by pcp ?She previously was on lisinopril in 2018 (prescribed but not actually taken)  ? ?BP Readings from Last 6 Encounters:   ?11/12/21 (!) 162/78  ?11/06/21 138/90  ?05/20/21 114/76  ?05/15/20 112/76  ?04/03/19 110/78  ?01/15/19 122/78  ? ? ?CP from last week has mostly resolved but she complains of severe acid reflux and indigestion ? ? ?Patient Active Problem List  ? Diagnosis Date Noted  ? Atherosclerosis of aorta ([place]) 05/15/2020  ? Hip pain, chronic, left 02/16/2017  ? Personal history of colonic polyps   ? Benign neoplasm of transverse colon   ? Lung nodule < 6cm on CT 08/19/2015  ? Chronic constipation 01/22/2015  ? GERD (gastroesophageal reflux disease) 01/22/2015  ? Osteopenia 01/22/2015  ? ? ? ? ?Current Outpatient Medications:  ?  Cholecalciferol (VITAMIN D) 50 MCG (2000 UT) CAPS, Take 1 capsule by mouth daily at 12 noon., Disp: , Rfl:  ?  hydrochlorothiazide (HYDRODIURIL) 12.5 MG tablet, Take 1 tablet (12.5 mg total) by mouth daily., Disp: 30 tablet, Rfl: 0 ?  Multiple Vitamin (MULTIVITAMIN) tablet, Take 1 tablet by mouth daily., Disp: , Rfl:  ? ? ?Allergies  ?Allergen Reactions  ? Tylenol [Acetaminophen] Swelling  ?  Lips  ? Ciprofloxacin Swelling and Rash  ?  LIPS AND THROAT  ? ? ? ?Social History  ? ?Tobacco Use  ? Smoking status: Never  ? Smokeless tobacco: Never  ?Vaping Use  ? Vaping Use: Never used  ?Substance Use Topics  ? Alcohol use: Yes  ?  Alcohol/week: 2.0 standard drinks  ?  Types: 2 Glasses of wine per week  ?  Comment: on occasion  ? Drug use: No  ?  ? ? ?Chart Review Today: ?I personally reviewed active problem list, medication list, allergies, family history, social history,  health maintenance, notes from last encounter, lab results, imaging with the patient/caregiver today. ? ? ?Review of Systems  ?Constitutional: Negative.  Negative for chills, diaphoresis, fatigue, fever and weight loss.  ?HENT: Negative.  Negative for congestion, ear pain, rhinorrhea, sinus pressure, sore throat and tinnitus.   ?Eyes: Negative.  Negative for blurred vision, photophobia, pain and redness.  ?Respiratory: Negative.   Negative for cough.   ?Cardiovascular: Negative.  Negative for chest pain.  ?Gastrointestinal:  Positive for nausea. Negative for abdominal pain, anorexia, change in bowel habit and vomiting.  ?Endocrine: Negative.   ?Genitourinary: Negative.   ?Musculoskeletal: Negative.  Negative for arthralgias, back pain, joint swelling, myalgias and neck pain.  ?Skin: Negative.   ?Allergic/Immunologic: Negative.   ?Neurological:  Positive for dizziness, tingling and headaches. Negative for vertigo, seizures, weakness, numbness and loss of balance.  ?Hematological: Negative.   ?Psychiatric/Behavioral: Negative.  The patient does not have insomnia.   ?All other systems reviewed and are negative. ? ?   ?Objective:  ? ?Vitals:  ? 11/12/21 1421  ?BP: (!) 162/78  ?Pulse: 85  ?Resp: 16  ?SpO2: 99%  ?Weight: 116 lb (52.6 kg)  ?Height: '5\' 2"'$  (1.575 m)  ?  ?Body mass index is 21.22 kg/m?. ? ?Physical Exam ?Constitutional:   ?   General: She is not in acute distress. ?   Appearance: Normal appearance. She is well-developed and normal weight. She is not ill-appearing, toxic-appearing or diaphoretic.  ?HENT:  ?   Head: Normocephalic and atraumatic.  ?   Right Ear: Tympanic membrane, ear canal and external ear normal. There is no impacted cerumen.  ?   Left Ear: Tympanic membrane, ear canal and external ear normal. There is no impacted cerumen.  ?   Nose: Nose normal. No congestion.  ?   Mouth/Throat:  ?   Mouth: Mucous membranes are moist.  ?   Pharynx: Oropharynx is clear. Uvula midline. No oropharyngeal exudate or posterior oropharyngeal erythema.  ?Eyes:  ?   General: Lids are normal. No scleral icterus.    ?   Right eye: No discharge.     ?   Left eye: No discharge.  ?   Conjunctiva/sclera: Conjunctivae normal.  ?   Pupils: Pupils are equal, round, and reactive to light.  ?Neck:  ?   Trachea: Phonation normal. No tracheal deviation.  ?Cardiovascular:  ?   Rate and Rhythm: Normal rate and regular rhythm.  ?   Pulses: Normal pulses.     ?      Radial pulses are 2+ on the right side and 2+ on the left side.  ?     Posterior tibial pulses are 2+ on the right side and 2+ on the left side.  ?   Heart sounds: Normal heart sounds. No murmur heard. ?  No friction rub. No gallop.  ?Pulmonary:  ?   Effort: Pulmonary effort is normal. No respiratory distress.  ?   Breath sounds: Normal breath sounds. No stridor. No wheezing, rhonchi or rales.  ?Chest:  ?   Chest wall: No tenderness.  ?Abdominal:  ?   General: Bowel sounds are normal. There is no distension.  ?   Palpations: Abdomen is soft.  ?   Tenderness: There is no abdominal tenderness. There is no guarding or rebound.  ?Musculoskeletal:     ?   General: No deformity. Normal range of motion.  ?   Cervical back: Normal range of motion and neck supple.  ?Lymphadenopathy:  ?  Cervical: No cervical adenopathy.  ?Skin: ?   General: Skin is warm and dry.  ?   Capillary Refill: Capillary refill takes less than 2 seconds.  ?   Coloration: Skin is not pale.  ?   Findings: No rash.  ?Neurological:  ?   Mental Status: She is alert. Mental status is at baseline.  ?   Motor: No abnormal muscle tone.  ?   Gait: Gait normal.  ?   Comments: MENTAL STATUS: AAOx3, memory intact, fund of knowledge appropriate ? ?LANG/SPEECH: Naming and repetition intact, fluent, no dysarthria, follows 3-step commands, answers questions appropriately ? ? ?CRANIAL NERVES: ?  II: Pupils equal and reactive, no RAPD ?  III, IV, VI: EOM intact, no gaze preference or deviation, no nystagmus. ?  V: normal sensation in V1, V2, and V3 segments bilaterally ?  VII: no asymmetry, no nasolabial fold flattening ?  VIII: normal hearing to speech ?  IX, X: normal palatal elevation, no uvular deviation ?  XI: 5/5 head turn and 5/5 shoulder shrug bilaterally ?  XII: midline tongue protrusion ? ?MOTOR:  ?5/5 bilateral grip strength ?5/5 strength dorsiflexion/plantarflexion b/l ? ? ?SENSORY:  ?Normal to light touch ?Romberg absent ? ?COORD: Normal finger to  nose, no tremor, no dysmetria ? ?STATION: normal stance, no truncal ataxia ? ?GAIT: Normal; patient able to tip-toe, heel-walk. ? ?  ?Psychiatric:     ?   Mood and Affect: Affect normal. Mood is anxious. Mood i

## 2021-12-02 NOTE — Progress Notes (Unsigned)
Name: Wendy Ashley   MRN: [id number]    DOB: 1955/11/13   Date:12/03/2021       Progress Note  Subjective  Chief Complaint  Follow Up  HPI  Chest pain and spiked bp: symptoms started suddenly in April and lasted three days, she had cardiac enzymes slightly elevated, went to [facility] and given reassurance, she came back and saw Leisa Tapia bp was also elevated, she was given Norvasc but bp has normalized without medication, she continues to have mild left sided chest pain and is willing to see cardiologist . She states she eats a balanced diet to avoid heartburn, advised to try taking pepcid to see if chest pain improves  Abnormal CT chest: she saw Dr. 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 She has a new insurance and states previous insurance did not cover the cost , she asked me to place a referral again.   Atherosclerosis of aorta: found on CT chest 2018 , LDL above 100, she refuses statin therapy, discussed increase risk of heart attacks and strokes . Unchanged   GERD: very seldom has heartburn or indigestion and takes otc medication prn  Chronic constipation: she takes green tea every day to be able to have a bowel movement, bristol scale 4 every 2 days, without the tea she has bristol 1 or no bowel movements She has hemorrhoids - colonoscopy is up to date   Right lower quadrant pain: it happened a couple of weeks ago, pain was sharp and constant, not associated with nausea, vomiting or change in bowel movements. No fever or chills, but noticed some change in appetite. It lasted a few days and resolved by itself. Advised to come in if it happens again. No hematuria, dysuria or vaginal bleeding   Patient Active Problem List   Diagnosis Date Noted   Atherosclerosis of aorta ([place]) 05/15/2020   Hip pain, chronic,  left 02/16/2017   Personal history of colonic polyps    Benign neoplasm of transverse colon    Lung nodule < 6cm on CT 08/19/2015   Chronic constipation 01/22/2015   GERD (gastroesophageal reflux disease) 01/22/2015   Osteopenia 01/22/2015    Past Surgical History:  Procedure Laterality Date   COLONOSCOPY WITH PROPOFOL N/A 12/22/2015   Procedure: COLONOSCOPY WITH PROPOFOL;  Surgeon: [name], MD;  Location: [facility];  Service: Endoscopy;  Laterality: N/A;   COLONOSCOPY WITH PROPOFOL N/A 01/15/2019   Procedure: COLONOSCOPY WITH PROPOFOL;  Surgeon: [name], MD;  Location: [facility];  Service: Endoscopy;  Laterality: N/A;   CYSTOSCOPY WITH BIOPSY N/A 09/17/2015   Procedure: CYSTOSCOPY WITH BIOPSY;  Surgeon: Nickie Retort, MD;  Location: [facility];  Service: Urology;  Laterality: N/A;   CYSTOSCOPY WITH FULGERATION N/A 09/17/2015   Procedure: CYSTOSCOPY WITH FULGERATION;  Surgeon: Nickie Retort, MD;  Location: [facility];  Service: Urology;  Laterality: N/A;   POLYPECTOMY  12/22/2015   Procedure: POLYPECTOMY;  Surgeon: [name], MD;  Location: [facility];  Service: Endoscopy;;   TUBAL LIGATION      Family History  Problem Relation Age of Onset   Hypertension Mother    Alzheimer's disease Father    Cancer Father        colon   Hypertension  Sister    Hypertension Brother    Hypertension Sister    Breast cancer Neg Hx     Social History   Tobacco Use   Smoking status: Never   Smokeless tobacco: Never  Substance Use Topics   Alcohol use: Yes    Alcohol/week: 2.0 standard drinks    Types: 2 Glasses of wine per week    Comment: on occasion     Current Outpatient Medications:    amLODipine (NORVASC) 10 MG tablet, Take 1/2 tablet (5 mg) po qam for 2-3 days and then take 1 tab (10 mg) poqam daily for high blood pressure, Disp: 90 tablet, Rfl: 0   Cholecalciferol (VITAMIN D) 50 MCG (2000 UT) CAPS, Take 1 capsule by mouth daily at 12 noon.,  Disp: , Rfl:    Multiple Vitamin (MULTIVITAMIN) tablet, Take 1 tablet by mouth daily., Disp: , Rfl:   Allergies  Allergen Reactions   Tylenol [Acetaminophen] Swelling    Lips   Ciprofloxacin Swelling and Rash    LIPS AND THROAT    I personally reviewed active problem list, medication list, allergies, family history, social history, health maintenance with the patient/caregiver today.   ROS  Constitutional: Negative for fever or weight change.  Respiratory: Negative for cough and shortness of breath.   Cardiovascular: positive for chest pain or palpitations.  Gastrointestinal: Negative for abdominal pain, no bowel changes.  Musculoskeletal: Negative for gait problem or joint swelling.  Skin: Negative for rash.  Neurological: Negative for dizziness or headache.  No other specific complaints in a complete review of systems (except as listed in HPI above).   Objective  Vitals:   12/03/21 1055  BP: 136/70  Pulse: 88  Resp: 16  Weight: 116 lb (52.6 kg)  Height: '5\' 2"'$  (1.575 m)    Body mass index is 21.22 kg/m.  Physical Exam  Constitutional: Patient appears well-developed and well-nourished.  No distress.  HEENT: head atraumatic, normocephalic, pupils equal and reactive to light, neck supple Cardiovascular: Normal rate, regular rhythm and normal heart sounds.  No murmur heard. No BLE edema. Pulmonary/Chest: Effort normal and breath sounds normal. No respiratory distress. Abdominal: Soft.  There is no tenderness. Psychiatric: Patient has a normal mood and affect. behavior is normal. Judgment and thought content normal.   Recent Results (from the past 2160 hour(s))  CBC with Differential/Platelet     Status: None   Collection Time: 11/06/21  1:12 PM  Result Value Ref Range   WBC 5.4 4.0 - 10.5 K/uL   RBC 4.55 3.87 - 5.11 MIL/uL   Hemoglobin 14.1 12.0 - 15.0 g/dL   HCT 41.3 36.0 - 46.0 %   MCV 90.8 80.0 - 100.0 fL   MCH 31.0 26.0 - 34.0 pg   MCHC 34.1 30.0 - 36.0 g/dL    RDW 12.3 11.5 - 15.5 %   Platelets 235 150 - 400 K/uL   nRBC 0.0 0.0 - 0.2 %   Neutrophils Relative % 62 %   Neutro Abs 3.4 1.7 - 7.7 K/uL   Lymphocytes Relative 25 %   Lymphs Abs 1.3 0.7 - 4.0 K/uL   Monocytes Relative 9 %   Monocytes Absolute 0.5 0.1 - 1.0 K/uL   Eosinophils Relative 3 %   Eosinophils Absolute 0.2 0.0 - 0.5 K/uL   Basophils Relative 1 %   Basophils Absolute 0.0 0.0 - 0.1 K/uL   Immature Granulocytes 0 %   Abs Immature Granulocytes 0.01 0.00 - 0.07 K/uL    Comment:  Performed at [facility], [address] [place], [place] [address]  TSH     Status: None   Collection Time: 11/06/21  1:12 PM  Result Value Ref Range   TSH 1.653 0.350 - 4.500 uIU/mL    Comment: Performed by a 3rd Generation assay with a functional sensitivity of <=0.01 uIU/mL. Performed at [facility], [address] [place], [place] [address]   Comprehensive metabolic panel     Status: Abnormal   Collection Time: 11/06/21  1:12 PM  Result Value Ref Range   Sodium 136 135 - 145 mmol/L   Potassium 3.8 3.5 - 5.1 mmol/L   Chloride 103 98 - 111 mmol/L   CO2 26 22 - 32 mmol/L   Glucose, Bld 100 (H) 70 - 99 mg/dL    Comment: Glucose reference range applies only to samples taken after fasting for at least 8 hours.   BUN 12 8 - 23 mg/dL   Creatinine, Ser 0.81 0.44 - 1.00 mg/dL   Calcium 9.5 8.9 - 10.3 mg/dL   Total Protein 7.0 6.5 - 8.1 g/dL   Albumin 4.4 3.5 - 5.0 g/dL   AST 22 15 - 41 U/L   ALT 19 0 - 44 U/L   Alkaline Phosphatase 65 38 - 126 U/L   Total Bilirubin 0.9 0.3 - 1.2 mg/dL   GFR, Estimated >60 >60 mL/min    Comment: (NOTE) Calculated using the CKD-EPI Creatinine Equation (2021)    Anion gap 7 5 - 15    Comment: Performed at [facility], [place], [place] [address]  Troponin I (High Sensitivity)     Status: None   Collection Time: 11/06/21  1:12 PM  Result Value Ref Range   Troponin I (High Sensitivity) 2 <18 ng/L    Comment:  (NOTE) Elevated high sensitivity troponin I (hsTnI) values and significant  changes across serial measurements may suggest ACS but many other  chronic and acute conditions are known to elevate hsTnI results.  Refer to the "Links" section for chest pain algorithms and additional  guidance. Performed at [facility], [address] [place], [place] [address]   CK total and CKMB (cardiac)not at [facility]     Status: None   Collection Time: 11/06/21  1:12 PM  Result Value Ref Range   Total CK 87 38 - 234 U/L   CK, MB 0.8 0.5 - 5.0 ng/mL   Relative Index RELATIVE INDEX IS INVALID 0.0 - 2.5    Comment: WHEN CK < 100 U/L        Performed at [facility], [address] [place], [place] [address]   Microalbumin / creatinine urine ratio     Status: Abnormal   Collection Time: 11/06/21  2:35 PM  Result Value Ref Range   Microalb, Ur 4.2 (H) Not Estab. ug/mL   Microalb Creat Ratio 16 0 - 29 mg/g creat    Comment: (NOTE)                       Normal:                0 -  29                       Moderately increased: 30 - 300                       Severely increased:       >  300 Performed At: [facility] [place], [place] [id number] Rush Farmer MD [id number]    Creatinine, Urine 25.8 Not Estab. mg/dL    PHQ2/9:    12/03/2021   10:55 AM 11/12/2021    2:21 PM 11/06/2021   11:29 AM 05/20/2021    7:36 AM 05/15/2020    9:02 AM  Depression screen PHQ 2/9  Decreased Interest 0 0 0 0 0  Down, Depressed, Hopeless 0 0 0 0 0  PHQ - 2 Score 0 0 0 0 0  Altered sleeping 0 0 0 0 0  Tired, decreased energy 0 0 0 0 0  Change in appetite 0 0 0 0 0  Feeling bad or failure about yourself  0 0 0 0 0  Trouble concentrating 0 0 0 0 0  Moving slowly or fidgety/restless 0 0 0 0 0  Suicidal thoughts 0 0 0 0 0  PHQ-9 Score 0 0 0 0 0    phq 9 is negative   Fall Risk:    12/03/2021   10:55 AM 11/12/2021    2:20 PM 11/06/2021   11:29 AM 05/20/2021    7:36 AM  05/15/2020    9:02 AM  Fall Risk   Falls in the past year? 0 0 0 0 0  Number falls in past yr: 0 0  0 0  Injury with Fall? 0 0  0 0  Risk for fall due to : No Fall Risks No Fall Risks No Fall Risks No Fall Risks   Follow up Falls prevention discussed Falls prevention discussed Falls prevention discussed Falls prevention discussed       Functional Status Survey: Is the patient deaf or have difficulty hearing?: No Does the patient have difficulty seeing, even when wearing glasses/contacts?: No Does the patient have difficulty concentrating, remembering, or making decisions?: No Does the patient have difficulty walking or climbing stairs?: No Does the patient have difficulty dressing or bathing?: No Does the patient have difficulty doing errands alone such as visiting a doctor's office or shopping?: No    Assessment & Plan  1. Chest pain in adult  - Ambulatory referral to Cardiology  2. Atherosclerosis of aorta ([facility])  Refused statin therapy   3. Chronic constipation   4. Abnormal CT of the chest  - CT CHEST NODULE FOLLOW UP LOW DOSE W/O; Future  5. Elevated blood pressure reading without diagnosis of hypertension  - Ambulatory referral to Cardiology  6. Multiple lung nodules  - CT CHEST NODULE FOLLOW UP LOW DOSE W/O; Future

## 2021-12-03 ENCOUNTER — Ambulatory Visit (INDEPENDENT_AMBULATORY_CARE_PROVIDER_SITE_OTHER): Payer: No Typology Code available for payment source | Admitting: Family Medicine

## 2021-12-03 ENCOUNTER — Encounter: Payer: Self-pay | Admitting: Family Medicine

## 2021-12-03 VITALS — BP 136/70 | HR 88 | Resp 16 | Ht 62.0 in | Wt 116.0 lb

## 2021-12-03 DIAGNOSIS — R918 Other nonspecific abnormal finding of lung field: Secondary | ICD-10-CM | POA: Diagnosis not present

## 2021-12-03 DIAGNOSIS — R9389 Abnormal findings on diagnostic imaging of other specified body structures: Secondary | ICD-10-CM | POA: Diagnosis not present

## 2021-12-03 DIAGNOSIS — R030 Elevated blood-pressure reading, without diagnosis of hypertension: Secondary | ICD-10-CM

## 2021-12-03 DIAGNOSIS — R079 Chest pain, unspecified: Secondary | ICD-10-CM | POA: Diagnosis not present

## 2021-12-03 DIAGNOSIS — I700 Atherosclerosis of aorta: Secondary | ICD-10-CM | POA: Diagnosis not present

## 2021-12-03 DIAGNOSIS — K5909 Other constipation: Secondary | ICD-10-CM

## 2022-01-18 ENCOUNTER — Ambulatory Visit
Admission: RE | Admit: 2022-01-18 | Discharge: 2022-01-18 | Disposition: A | Discharge status: Home / Self Care | Payer: No Typology Code available for payment source | Source: Ambulatory Visit | Attending: Family Medicine | Admitting: Family Medicine

## 2022-01-18 ENCOUNTER — Ambulatory Visit (INDEPENDENT_AMBULATORY_CARE_PROVIDER_SITE_OTHER): Payer: No Typology Code available for payment source | Admitting: Cardiology

## 2022-01-18 ENCOUNTER — Other Ambulatory Visit
Admission: RE | Admit: 2022-01-18 | Discharge: 2022-01-18 | Disposition: A | Discharge status: Home / Self Care | Payer: No Typology Code available for payment source | Source: Ambulatory Visit | Attending: Family Medicine | Admitting: Family Medicine

## 2022-01-18 ENCOUNTER — Encounter: Payer: Self-pay | Admitting: Cardiology

## 2022-01-18 VITALS — BP 142/88 | HR 82 | Ht 62.0 in | Wt 114.2 lb

## 2022-01-18 DIAGNOSIS — R072 Precordial pain: Secondary | ICD-10-CM | POA: Insufficient documentation

## 2022-01-18 DIAGNOSIS — R918 Other nonspecific abnormal finding of lung field: Secondary | ICD-10-CM | POA: Insufficient documentation

## 2022-01-18 DIAGNOSIS — R030 Elevated blood-pressure reading, without diagnosis of hypertension: Secondary | ICD-10-CM

## 2022-01-18 DIAGNOSIS — R9389 Abnormal findings on diagnostic imaging of other specified body structures: Secondary | ICD-10-CM | POA: Diagnosis not present

## 2022-01-18 DIAGNOSIS — E7800 Pure hypercholesterolemia, unspecified: Secondary | ICD-10-CM | POA: Diagnosis not present

## 2022-01-18 LAB — BASIC METABOLIC PANEL
Anion gap: 7 (ref 5–15)
BUN: 16 mg/dL (ref 8–23)
CO2: 27 mmol/L (ref 22–32)
Calcium: 9.2 mg/dL (ref 8.9–10.3)
Chloride: 106 mmol/L (ref 98–111)
Creatinine, Ser: 0.65 mg/dL (ref 0.44–1.00)
GFR, Estimated: >60 mL/min (ref >60)
Glucose, Bld: 93 mg/dL (ref 70–99)
Potassium: 3.8 mmol/L (ref 3.5–5.1)
Sodium: 140 mmol/L (ref 135–145)

## 2022-01-18 MED ORDER — IVABRADINE HCL 5 MG PO TABS: 10.0000 mg | ORAL_TABLET | Freq: Once | ORAL | 0 refills | Status: AC

## 2022-01-18 MED ORDER — METOPROLOL TARTRATE 100 MG PO TABS: 100.0000 mg | ORAL_TABLET | Freq: Once | ORAL | 0 refills | Status: DC
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
## 2022-01-18 NOTE — Patient Instructions (Signed)
Medication Instructions:   Your physician recommends that you continue on your current medications as directed. Please refer to the Current Medication list given to you today.  *If you need a refill on your cardiac medications before your next appointment, please call your pharmacy*   Lab Work:  Please go to the [facility] after your appointment today for a Lab (BMP) draw.   Testing/Procedures:  Your physician has requested that you have an echocardiogram. Echocardiography is a painless test that uses sound waves to create images of your heart. It provides your doctor with information about the size and shape of your heart and how well your heart's chambers and valves are working. This procedure takes approximately one hour. There are no restrictions for this procedure.  2.   Your physician has requested that you have cardiac CT. Cardiac computed tomography (CT) is a painless test that uses an x-ray machine to take clear, detailed pictures of your heart.    Your cardiac CT will be scheduled at:  [facility] [address] [place], [place] [address] [phone]  Thursday 7/13 at 10:15 AM  Please arrive 15 mins early for check-in and test prep.    Please follow these instructions carefully (unless otherwise directed):    On the Night Before the Test: Be sure to Drink plenty of water. Do not consume any caffeinated/decaffeinated beverages or chocolate 12 hours prior to your test.    On the Day of the Test: Drink plenty of water until 1 hour prior to the test. Do not eat any food 4 hours prior to the test. You may take your regular medications prior to the test.  Take metoprolol (Lopressor) 100 MG two hours prior to test. Take Ivabradine (Corlanor) 10 MG two hours prior to test. FEMALES- please wear underwire-free bra if available, avoid dresses & tight clothing        After the Test: Drink plenty of water. After  receiving IV contrast, you may experience a mild flushed feeling. This is normal. On occasion, you may experience a mild rash up to 24 hours after the test. This is not dangerous. If this occurs, you can take Benadryl 25 mg and increase your fluid intake. If you experience trouble breathing, this can be serious. If it is severe call 911 IMMEDIATELY. If it is mild, please call our office. If you take any of these medications: Glipizide/Metformin, Avandament, Glucavance, please do not take 48 hours after completing test unless otherwise instructed.  Please allow 2-4 weeks for scheduling of routine cardiac CTs. Some insurance companies require a pre-authorization which may delay scheduling of this test.   For non-scheduling related questions, please contact the cardiac imaging nurse navigator should you have any questions/concerns: Marchia Bond, Cardiac Imaging Nurse Navigator Gordy Clement, Cardiac Imaging Nurse Navigator  [facility] Direct Office Dial: [phone]   For scheduling needs, including cancellations and rescheduling, please call [place], [phone].     Follow-Up: At [facility], you and your health needs are our priority.  As part of our continuing mission to provide you with exceptional heart care, we have created designated Provider Care Teams.  These Care Teams include your primary Cardiologist (physician) and Advanced Practice Providers (APPs -  Physician Assistants and Nurse Practitioners) who all work together to provide you with the care you need, when you need it.  We recommend signing up for the patient portal called "MyChart".  Sign up information is provided on this  After Visit Summary.  MyChart is used to connect with patients for Virtual Visits (Telemedicine).  Patients are able to view lab/test results, encounter notes, upcoming appointments, etc.  Non-urgent messages can be sent to your provider as well.   To learn more about what you  can do with MyChart, go to [email or web address].    Your next appointment:   Follow up after testing   The format for your next appointment:   In Person  Provider:   You may see Kate Sable, MD or one of the following Advanced Practice Providers on your designated Care Team:   Murray Hodgkins, NP Christell Faith, PA-C Cadence Kathlen Mody, [place]   Important Information About Sugar

## 2022-01-20 ENCOUNTER — Telehealth (HOSPITAL_COMMUNITY): Payer: Self-pay | Admitting: Emergency Medicine

## 2022-01-20 NOTE — Telephone Encounter (Signed)
Reaching out to patient to offer assistance regarding upcoming cardiac imaging study; pt verbalizes understanding of appt date/time, parking situation and where to check in, pre-test NPO status and medications ordered, and verified current allergies; name and call back number provided for further questions should they arise Wendy Bond RN Navigator Cardiac Imaging [facility] [phone] office [phone] cell  Denies iv issues  '100mg'$  metop + '10mg'$  ivab Arrival 1000

## 2022-01-21 ENCOUNTER — Ambulatory Visit
Admission: RE | Admit: 2022-01-21 | Discharge: 2022-01-21 | Disposition: A | Discharge status: Home / Self Care | Payer: No Typology Code available for payment source | Source: Ambulatory Visit | Attending: Cardiology | Admitting: Cardiology

## 2022-01-21 DIAGNOSIS — R072 Precordial pain: Secondary | ICD-10-CM | POA: Diagnosis not present

## 2022-01-21 MED ORDER — IOHEXOL 350 MG/ML SOLN
75.0000 mL | Freq: Once | INTRAVENOUS | Status: AC | PRN
  Administered 2022-01-21: 75 mL via INTRAVENOUS

## 2022-01-21 MED ORDER — NITROGLYCERIN 0.4 MG SL SUBL
0.8000 mg | SUBLINGUAL_TABLET | Freq: Once | SUBLINGUAL | Status: AC
  Administered 2022-01-21: 0.8 mg via SUBLINGUAL

## 2022-01-21 NOTE — Progress Notes (Signed)
Patient tolerated CT well. Drank water after. Vital signs stable encourage to drink water throughout day.Reasons explained and verbalized understanding. Ambulated steady gait.  

## 2022-02-16 ENCOUNTER — Ambulatory Visit (INDEPENDENT_AMBULATORY_CARE_PROVIDER_SITE_OTHER): Payer: No Typology Code available for payment source

## 2022-02-16 DIAGNOSIS — R072 Precordial pain: Secondary | ICD-10-CM

## 2022-02-16 LAB — ECHOCARDIOGRAM COMPLETE
AR max vel: 2.68 cm2
AV Area VTI: 2.54 cm2
AV Area mean vel: 2.24 cm2
AV Mean grad: 3 mmHg
AV Peak grad: 4.9 mmHg
Ao pk vel: 1.11 m/s
Area-P 1/2: 3.45 cm2
Calc EF: 65 %
S' Lateral: 2.5 cm
Single Plane A2C EF: 66.6 %
Single Plane A4C EF: 66 %

## 2022-02-18 ENCOUNTER — Telehealth: Payer: Self-pay | Admitting: Emergency Medicine

## 2022-02-18 NOTE — Telephone Encounter (Signed)
-----   Message from Kate Sable, MD sent at 02/16/2022  2:11 PM EDT ----- Echo shows normal systolic and diastolic function, no findings to suggest etiology of chest pain.

## 2022-02-18 NOTE — Telephone Encounter (Signed)
Called and spoke with patient. Results reviewed with patient, pt verbalized understanding,  questions (if any) answered.   ?

## 2022-03-04 NOTE — Progress Notes (Signed)
Name: Wendy Ashley   MRN: [id number]    DOB: August 30, 1955   Date:03/05/2022       Progress Note  Subjective  Chief Complaint  Follow Up  HPI  Nocturia and bladder spasms: she states she has nocturia about 3-4 times per night going on for the past 6 months or so. No hematuria or dysuria, but she has noticed intermittent bladder spasms during the day or night. During the day she voids at least 2 hours apart   Husband states she snores at night, ESS today was 17, she has daytime somnolence    Patient Active Problem List   Diagnosis Date Noted   Atherosclerosis of aorta ([place]) 05/15/2020   Hip pain, chronic, left 02/16/2017   Personal history of colonic polyps    Benign neoplasm of transverse colon    Lung nodule < 6cm on CT 08/19/2015   Chronic constipation 01/22/2015   GERD (gastroesophageal reflux disease) 01/22/2015   Osteopenia 01/22/2015    Past Surgical History:  Procedure Laterality Date   ABDOMINAL SURGERY     Tummy tuck   COLONOSCOPY WITH PROPOFOL N/A 12/22/2015   Procedure: COLONOSCOPY WITH PROPOFOL;  Surgeon: [name], MD;  Location: [place];  Service: Endoscopy;  Laterality: N/A;   COLONOSCOPY WITH PROPOFOL N/A 01/15/2019   Procedure: COLONOSCOPY WITH PROPOFOL;  Surgeon: [name], MD;  Location: [place];  Service: Endoscopy;  Laterality: N/A;   CYSTOSCOPY WITH BIOPSY N/A 09/17/2015   Procedure: CYSTOSCOPY WITH BIOPSY;  Surgeon: Nickie Retort, MD;  Location: [facility];  Service: Urology;  Laterality: N/A;   CYSTOSCOPY WITH FULGERATION N/A 09/17/2015   Procedure: CYSTOSCOPY WITH FULGERATION;  Surgeon: Nickie Retort, MD;  Location: [facility];  Service: Urology;  Laterality: N/A;   POLYPECTOMY  12/22/2015   Procedure: POLYPECTOMY;  Surgeon: [name], MD;  Location: [place];  Service: Endoscopy;;   TUBAL LIGATION      Family History  Problem Relation Age of Onset   Hypertension Mother    Heart Problems Mother     Alzheimer's disease Father    Cancer Father        colon   Hypertension Sister    Hypertension Sister    Hypertension Brother    Breast cancer Neg Hx     Social History   Tobacco Use   Smoking status: Never   Smokeless tobacco: Never  Substance Use Topics   Alcohol use: Yes    Alcohol/week: 2.0 standard drinks of alcohol    Types: 2 Glasses of wine per week    Comment: on occasion     Current Outpatient Medications:    Cholecalciferol (VITAMIN D) 50 MCG (2000 UT) CAPS, Take 1 capsule by mouth daily at 12 noon., Disp: , Rfl:    Multiple Vitamin (MULTIVITAMIN) tablet, Take 1 tablet by mouth daily., Disp: , Rfl:    metoprolol tartrate (LOPRESSOR) 100 MG tablet, Take 1 tablet (100 mg total) by mouth once for 1 dose. Take 2 hours prior to your CT scan., Disp: 1 tablet, Rfl: 0  Allergies  Allergen Reactions   Tylenol [Acetaminophen] Swelling    Lips   Ciprofloxacin Swelling and Rash    LIPS AND THROAT    I personally reviewed active problem list, medication list, allergies, family history, social history, health maintenance with the patient/caregiver today.   ROS  Ten systems reviewed and is negative except as mentioned in HPI   Objective  Vitals:   03/05/22 1014  BP: 138/72  Pulse: 92  Resp: 16  SpO2: 99%  Weight: 114 lb (51.7 kg)  Height: '5\' 2"'$  (1.575 m)    Body mass index is 20.85 kg/m.  Physical Exam  Constitutional: Patient appears well-developed and well-nourished.  No distress.  HEENT: head atraumatic, normocephalic, pupils equal and reactive to light, neck supple, throat within normal limits Cardiovascular: Normal rate, regular rhythm and normal heart sounds.  No murmur heard. No BLE edema. Pulmonary/Chest: Effort normal and breath sounds normal. No respiratory distress. Abdominal: Soft.  There is no tenderness.Negative CVA tenderness, normal bowel sounds  Psychiatric: Patient has a normal mood and affect. behavior is normal. Judgment and thought  content normal.   Recent Results (from the past 2160 hour(s))  Basic metabolic panel     Status: None   Collection Time: 01/18/22 10:39 AM  Result Value Ref Range   Sodium 140 135 - 145 mmol/L   Potassium 3.8 3.5 - 5.1 mmol/L   Chloride 106 98 - 111 mmol/L   CO2 27 22 - 32 mmol/L   Glucose, Bld 93 70 - 99 mg/dL    Comment: Glucose reference range applies only to samples taken after fasting for at least 8 hours.   BUN 16 8 - 23 mg/dL   Creatinine, Ser 0.65 0.44 - 1.00 mg/dL   Calcium 9.2 8.9 - 10.3 mg/dL   GFR, Estimated >60 >60 mL/min    Comment: (NOTE) Calculated using the CKD-EPI Creatinine Equation (2021)    Anion gap 7 5 - 15    Comment: Performed at [facility], [address] [place], [place] [id number]  ECHOCARDIOGRAM COMPLETE     Status: None   Collection Time: 02/16/22 12:16 PM  Result Value Ref Range   AR max vel 2.68 cm2   AV Peak grad 4.9 mmHg   Ao pk vel 1.11 m/s   S' Lateral 2.50 cm   Area-P 1/2 3.45 cm2   AV Area VTI 2.54 cm2   AV Mean grad 3.0 mmHg   Single Plane A4C EF 66.0 %   Single Plane A2C EF 66.6 %   Calc EF 65.0 %   AV Area mean vel 2.24 cm2  POCT Urinalysis Dipstick     Status: Abnormal   Collection Time: 03/05/22 10:22 AM  Result Value Ref Range   Color, UA Yellow    Clarity, UA Clear    Glucose, UA Negative Negative   Bilirubin, UA Negative    Ketones, UA Negative    Spec Grav, UA <=1.005 (A) 1.010 - 1.025   Blood, UA Negative    pH, UA 7.5 5.0 - 8.0   Protein, UA Negative Negative   Urobilinogen, UA 0.2 0.2 or 1.0 E.U./dL   Nitrite, UA Negative    Leukocytes, UA Negative Negative   Appearance Normal    Odor None     PHQ2/9:    03/05/2022   10:14 AM 12/03/2021   10:55 AM 11/12/2021    2:21 PM 11/06/2021   11:29 AM 05/20/2021    7:36 AM  Depression screen PHQ 2/9  Decreased Interest 0 0 0 0 0  Down, Depressed, Hopeless 0 0 0 0 0  PHQ - 2 Score 0 0 0 0 0  Altered sleeping 0 0 0 0 0  Tired, decreased energy 0 0 0 0 0   Change in appetite 0 0 0 0 0  Feeling bad or failure about yourself  0 0 0 0 0  Trouble concentrating 0 0  0 0 0  Moving slowly or fidgety/restless 0 0 0 0 0  Suicidal thoughts 0 0 0 0 0  PHQ-9 Score 0 0 0 0 0    phq 9 is negative   Fall Risk:    03/05/2022   10:13 AM 12/03/2021   10:55 AM 11/12/2021    2:20 PM 11/06/2021   11:29 AM 05/20/2021    7:36 AM  Fall Risk   Falls in the past year? 0 0 0 0 0  Number falls in past yr: 0 0 0  0  Injury with Fall? 0 0 0  0  Risk for fall due to : No Fall Risks No Fall Risks No Fall Risks No Fall Risks No Fall Risks  Follow up Falls prevention discussed Falls prevention discussed Falls prevention discussed Falls prevention discussed Falls prevention discussed      Functional Status Survey: Is the patient deaf or have difficulty hearing?: No Does the patient have difficulty seeing, even when wearing glasses/contacts?: No Does the patient have difficulty concentrating, remembering, or making decisions?: No Does the patient have difficulty walking or climbing stairs?: No Does the patient have difficulty dressing or bathing?: No Does the patient have difficulty doing errands alone such as visiting a doctor's office or shopping?: No    Assessment & Plan   1. Nocturia  - Cervicovaginal ancillary only If cultures are negative we will start her on nortriptyline for symptom control and if no improvement refer to urologist   2. Bladder spasms  - POCT Urinalysis Dipstick - Urine Culture - Cervicovaginal ancillary only  3. Snoring  - Ambulatory referral to Sleep Studies  4. Daytime somnolence  - Ambulatory referral to Sleep Studies

## 2022-03-05 ENCOUNTER — Encounter: Payer: Self-pay | Admitting: Family Medicine

## 2022-03-05 ENCOUNTER — Ambulatory Visit: Payer: No Typology Code available for payment source | Admitting: Medical

## 2022-03-05 ENCOUNTER — Ambulatory Visit (INDEPENDENT_AMBULATORY_CARE_PROVIDER_SITE_OTHER): Payer: No Typology Code available for payment source | Admitting: Family Medicine

## 2022-03-05 ENCOUNTER — Other Ambulatory Visit (HOSPITAL_COMMUNITY)
Admission: RE | Admit: 2022-03-05 | Discharge: 2022-03-05 | Disposition: A | Discharge status: Home / Self Care | Payer: No Typology Code available for payment source | Source: Ambulatory Visit | Attending: Family Medicine | Admitting: Family Medicine

## 2022-03-05 VITALS — BP 138/72 | HR 92 | Resp 16 | Ht 62.0 in | Wt 114.0 lb

## 2022-03-05 DIAGNOSIS — R0683 Snoring: Secondary | ICD-10-CM

## 2022-03-05 DIAGNOSIS — R351 Nocturia: Secondary | ICD-10-CM | POA: Diagnosis not present

## 2022-03-05 DIAGNOSIS — N3289 Other specified disorders of bladder: Secondary | ICD-10-CM | POA: Diagnosis not present

## 2022-03-05 DIAGNOSIS — R350 Frequency of micturition: Secondary | ICD-10-CM | POA: Diagnosis not present

## 2022-03-05 DIAGNOSIS — R400 Somnolence: Secondary | ICD-10-CM

## 2022-03-05 LAB — POCT URINALYSIS DIPSTICK
Appearance: NORMAL
Bilirubin, UA: NEGATIVE
Blood, UA: NEGATIVE
Glucose, UA: NEGATIVE
Ketones, UA: NEGATIVE
Leukocytes, UA: NEGATIVE
Nitrite, UA: NEGATIVE
Protein, UA: NEGATIVE
Spec Grav, UA: <=1.005 — AB (ref 1.010–1.025)
Urobilinogen, UA: 0.2 E.U./dL
pH, UA: 7.5 (ref 5.0–8.0)

## 2022-03-05 NOTE — Progress Notes (Unsigned)
Name: Wendy Ashley   MRN: [id number]    DOB: [birth year]   Date:03/08/2022       Progress Note  Subjective  Chief Complaint  Annual Exam  HPI  Patient presents for annual CPE.  Diet: cooks at home, balanced  Exercise: continue regular physical activity   Last Eye Exam: once a year  Last Dental Exam: once a year   [place] Visit from 03/08/2022 in [facility]  AUDIT-C Score 0      Depression: Phq 9 is  negative    03/08/2022   10:19 AM 03/05/2022   10:14 AM 12/03/2021   10:55 AM 11/12/2021    2:21 PM 11/06/2021   11:29 AM  Depression screen PHQ 2/9  Decreased Interest 0 0 0 0 0  Down, Depressed, Hopeless 0 0 0 0 0  PHQ - 2 Score 0 0 0 0 0  Altered sleeping 0 0 0 0 0  Tired, decreased energy 0 0 0 0 0  Change in appetite 0 0 0 0 0  Feeling bad or failure about yourself  0 0 0 0 0  Trouble concentrating 0 0 0 0 0  Moving slowly or fidgety/restless 0 0 0 0 0  Suicidal thoughts 0 0 0 0 0  PHQ-9 Score 0 0 0 0 0  Difficult doing work/chores Not difficult at all       Hypertension: BP Readings from Last 3 Encounters:  03/08/22 138/80  03/05/22 138/72  01/21/22 112/77   Obesity: Wt Readings from Last 3 Encounters:  03/08/22 112 lb 14.4 oz (51.2 kg)  03/05/22 114 lb (51.7 kg)  01/18/22 114 lb 4 oz (51.8 kg)   BMI Readings from Last 3 Encounters:  03/08/22 20.65 kg/m  03/05/22 20.85 kg/m  01/18/22 20.90 kg/m     Vaccines:   HPV: N/A Tdap: up to date Shingrix: up to date Pneumonia: Had Pneumo-23, due for Prevnar-20 Flu: due COVID-19: up to date   Hep C Screening: 11/04/15 STD testing and prevention (HIV/chl/gon/syphilis): 11/04/15 Intimate partner violence: negative screen  Sexual History :one partner  Menstrual History/LMP/Abnormal Bleeding: s/p menopause  Discussed importance of follow up if any post-menopausal bleeding: yes  Incontinence Symptoms: nocturia, waiting for sleep study and also advised to go to Urologist if  sleep negative, urine culture was negative   Breast cancer:  - Last Mammogram: 03/25/21 - BRCA gene screening: N/A  Osteoporosis Prevention : Discussed high calcium and vitamin D supplementation, weight bearing exercises Bone density: 06/09/21 showed osteopenia   Cervical cancer screening: 05/15/20  Skin cancer: Discussed monitoring for atypical lesions  Colorectal cancer: 01/15/19   Lung cancer:  Low Dose CT Chest recommended if Age 18-80 years, 20 pack-year currently smoking OR have quit w/in 15years. Patient does not qualify for screen   ECG: 01/18/22  Advanced Care Planning: A voluntary discussion about advance care planning including the explanation and discussion of advance directives.  Discussed health care proxy and Living will, and the patient was able to identify a health care proxy as husband.  Patient does not have a living will and power of attorney of health care   Lipids: Lab Results  Component Value Date   CHOL 222 (H) 05/20/2021   CHOL 204 (H) 05/15/2020   CHOL 205 (H) 04/03/2019   Lab Results  Component Value Date   HDL 71 05/20/2021   HDL 67 05/15/2020   HDL 63 04/03/2019   Lab Results  Component Value Date  LDLCALC 135 (H) 05/20/2021   LDLCALC 119 (H) 05/15/2020   LDLCALC 119 (H) 04/03/2019   Lab Results  Component Value Date   TRIG 67 05/20/2021   TRIG 81 05/15/2020   TRIG 120 04/03/2019   Lab Results  Component Value Date   CHOLHDL 3.1 05/20/2021   CHOLHDL 3.0 05/15/2020   CHOLHDL 3.3 04/03/2019   No results found for: "LDLDIRECT"  Glucose: Glucose  Date Value Ref Range Status  06/04/2013 101 (H) 65 - 99 mg/dL Final   Glucose, Bld  Date Value Ref Range Status  01/18/2022 93 70 - 99 mg/dL Final    Comment:    Glucose reference range applies only to samples taken after fasting for at least 8 hours.  11/06/2021 100 (H) 70 - 99 mg/dL Final    Comment:    Glucose reference range applies only to samples taken after fasting for at least 8  hours.  05/20/2021 89 65 - 99 mg/dL Final    Comment:    .            Fasting reference interval .     Patient Active Problem List   Diagnosis Date Noted   Atherosclerosis of aorta ([place]) 05/15/2020   Hip pain, chronic, left 02/16/2017   Personal history of colonic polyps    Benign neoplasm of transverse colon    Lung nodule < 6cm on CT 08/19/2015   Chronic constipation 01/22/2015   GERD (gastroesophageal reflux disease) 01/22/2015   Osteopenia 01/22/2015    Past Surgical History:  Procedure Laterality Date   ABDOMINAL SURGERY     Tummy tuck   COLONOSCOPY WITH PROPOFOL N/A 12/22/2015   Procedure: COLONOSCOPY WITH PROPOFOL;  Surgeon: [name], MD;  Location: [place];  Service: Endoscopy;  Laterality: N/A;   COLONOSCOPY WITH PROPOFOL N/A 01/15/2019   Procedure: COLONOSCOPY WITH PROPOFOL;  Surgeon: [name], MD;  Location: [place];  Service: Endoscopy;  Laterality: N/A;   CYSTOSCOPY WITH BIOPSY N/A 09/17/2015   Procedure: CYSTOSCOPY WITH BIOPSY;  Surgeon: Nickie Retort, MD;  Location: [facility];  Service: Urology;  Laterality: N/A;   CYSTOSCOPY WITH FULGERATION N/A 09/17/2015   Procedure: CYSTOSCOPY WITH FULGERATION;  Surgeon: Nickie Retort, MD;  Location: [facility];  Service: Urology;  Laterality: N/A;   POLYPECTOMY  12/22/2015   Procedure: POLYPECTOMY;  Surgeon: [name], MD;  Location: [place];  Service: Endoscopy;;   TUBAL LIGATION      Family History  Problem Relation Age of Onset   Hypertension Mother    Heart Problems Mother    Alzheimer's disease Father    Cancer Father        colon   Hypertension Sister    Hypertension Sister    Hypertension Brother    Breast cancer Neg Hx     Social History   Socioeconomic History   Marital status: Married    Spouse name: Anne Hahn   Number of children: 3   Years of education: Not on file   Highest education level: Bachelor's degree (e.g., BA, AB, BS)  Occupational  History   Occupation: unemployed  Tobacco Use   Smoking status: Never   Smokeless tobacco: Never  Vaping Use   Vaping Use: Never used  Substance and Sexual Activity   Alcohol use: Yes    Alcohol/week: 2.0 standard drinks of alcohol    Types: 2 Glasses of wine per week    Comment: on occasion   Drug use: No  Sexual activity: Yes    Partners: Male  Other Topics Concern   Not on file  Social History Narrative   Originally from [place], moved to [place] with husband from up [place].    Social Determinants of Health   Financial Resource Strain: Low Risk  (03/08/2022)   Overall Financial Resource Strain (CARDIA)    Difficulty of Paying Living Expenses: Not hard at all  Food Insecurity: No Food Insecurity (03/08/2022)   Hunger Vital Sign    Worried About Running Out of Food in the Last Year: Never true    Ran Out of Food in the Last Year: Never true  Transportation Needs: No Transportation Needs (03/08/2022)   PRAPARE - Hydrologist (Medical): No    Lack of Transportation (Non-Medical): No  Physical Activity: Sufficiently Active (03/08/2022)   Exercise Vital Sign    Days of Exercise per Week: 3 days    Minutes of Exercise per Session: 50 min  Stress: No Stress Concern Present (03/08/2022)   Liberal    Feeling of Stress : Not at all  Social Connections: Moderately Integrated (03/08/2022)   Social Connection and Isolation Panel [NHANES]    Frequency of Communication with Friends and Family: More than three times a week    Frequency of Social Gatherings with Friends and Family: Once a week    Attends Religious Services: 1 to 4 times per year    Active Member of [employer] or Organizations: No    Attends Archivist Meetings: Never    Marital Status: Married  Human resources officer Violence: Not At Risk (03/08/2022)   Humiliation, Afraid, Rape, and Kick questionnaire    Fear of Current or  Ex-Partner: No    Emotionally Abused: No    Physically Abused: No    Sexually Abused: No     Current Outpatient Medications:    Cholecalciferol (VITAMIN D) 50 MCG (2000 UT) CAPS, Take 1 capsule by mouth daily at 12 noon., Disp: , Rfl:    Multiple Vitamin (MULTIVITAMIN) tablet, Take 1 tablet by mouth daily., Disp: , Rfl:    metoprolol tartrate (LOPRESSOR) 100 MG tablet, Take 1 tablet (100 mg total) by mouth once for 1 dose. Take 2 hours prior to your CT scan., Disp: 1 tablet, Rfl: 0  Allergies  Allergen Reactions   Tylenol [Acetaminophen] Swelling    Lips   Ciprofloxacin Swelling and Rash    LIPS AND THROAT     ROS  Constitutional: Negative for fever or weight change.  Respiratory: Negative for cough and shortness of breath.   Cardiovascular: Negative for chest pain or palpitations.  Gastrointestinal: Negative for abdominal pain, no bowel changes.  Musculoskeletal: Negative for gait problem or joint swelling.  Skin: Negative for rash.  Neurological: Negative for dizziness or headache.  No other specific complaints in a complete review of systems (except as listed in HPI above).   Objective  Vitals:   03/08/22 1023  BP: 138/80  Pulse: 84  Resp: 16  Temp: 98 F (36.7 C)  TempSrc: Oral  SpO2: 98%  Weight: 112 lb 14.4 oz (51.2 kg)  Height: _0  (1.575 m)    Body mass index is 20.65 kg/m.  Physical Exam  Constitutional: Patient appears well-developed and well-nourished. No distress.  HENT: Head: Normocephalic and atraumatic. Ears: B TMs ok, no erythema or effusion; Nose: Nose normal. Mouth/Throat: Oropharynx is clear and moist. No oropharyngeal exudate.  Eyes: Conjunctivae and  EOM are normal. Pupils are equal, round, and reactive to light. No scleral icterus.  Neck: Normal range of motion. Neck supple. No JVD present. No thyromegaly present.  Cardiovascular: Normal rate, regular rhythm and normal heart sounds.  No murmur heard. No BLE edema. Pulmonary/Chest: Effort  normal and breath sounds normal. No respiratory distress. Abdominal: Soft. Bowel sounds are normal, no distension. There is no tenderness. no masses Breast: round lump at 11 o'clock right breast, inverted lump on left  FEMALE GENITALIA:  Not done  RECTAL:not done  Musculoskeletal: Normal range of motion, no joint effusions. No gross deformities Neurological: he is alert and oriented to person, place, and time. No cranial nerve deficit. Coordination, balance, strength, speech and gait are normal.  Skin: Skin is warm and dry. No rash noted. No erythema.  Psychiatric: Patient has a normal mood and affect. behavior is normal. Judgment and thought content normal.   Recent Results (from the past 2160 hour(s))  Basic metabolic panel     Status: None   Collection Time: 01/18/22 10:39 AM  Result Value Ref Range   Sodium 140 135 - 145 mmol/L   Potassium 3.8 3.5 - 5.1 mmol/L   Chloride 106 98 - 111 mmol/L   CO2 27 22 - 32 mmol/L   Glucose, Bld 93 70 - 99 mg/dL    Comment: Glucose reference range applies only to samples taken after fasting for at least 8 hours.   BUN 16 8 - 23 mg/dL   Creatinine, Ser 0.65 0.44 - 1.00 mg/dL   Calcium 9.2 8.9 - 10.3 mg/dL   GFR, Estimated >60 >60 mL/min    Comment: (NOTE) Calculated using the CKD-EPI Creatinine Equation (2021)    Anion gap 7 5 - 15    Comment: Performed at [facility], [address] [place], [place] [address]  ECHOCARDIOGRAM COMPLETE     Status: None   Collection Time: 02/16/22 12:16 PM  Result Value Ref Range   AR max vel 2.68 cm2   AV Peak grad 4.9 mmHg   Ao pk vel 1.11 m/s   S' Lateral 2.50 cm   Area-P 1/2 3.45 cm2   AV Area VTI 2.54 cm2   AV Mean grad 3.0 mmHg   Single Plane A4C EF 66.0 %   Single Plane A2C EF 66.6 %   Calc EF 65.0 %   AV Area mean vel 2.24 cm2  POCT Urinalysis Dipstick     Status: Abnormal   Collection Time: 03/05/22 10:22 AM  Result Value Ref Range   Color, UA Yellow    Clarity, UA Clear     Glucose, UA Negative Negative   Bilirubin, UA Negative    Ketones, UA Negative    Spec Grav, UA <=1.005 (A) 1.010 - 1.025   Blood, UA Negative    pH, UA 7.5 5.0 - 8.0   Protein, UA Negative Negative   Urobilinogen, UA 0.2 0.2 or 1.0 E.U./dL   Nitrite, UA Negative    Leukocytes, UA Negative Negative   Appearance Normal    Odor None   Urine Culture     Status: None   Collection Time: 03/05/22 11:30 AM   Specimen: Urine  Result Value Ref Range   MICRO NUMBER: [id number]    SPECIMEN QUALITY: Adequate    Sample Source URINE    STATUS: FINAL    Result:      Mixed genital flora isolated. These superficial bacteria are not indicative of a urinary tract infection. No further  organism identification is warranted on this specimen. If clinically indicated, recollect clean-catch, mid-stream urine and transfer  immediately to Urine Culture Transport Tube.      Fall Risk:    03/08/2022   10:18 AM 03/05/2022   10:13 AM 12/03/2021   10:55 AM 11/12/2021    2:20 PM 11/06/2021   11:29 AM  Fall Risk   Falls in the past year? 0 0 0 0 0  Number falls in past yr: 0 0 0 0   Injury with Fall? 0 0 0 0   Risk for fall due to : _0   Follow up Falls prevention discussed;Education provided Falls prevention discussed Falls prevention discussed Falls prevention discussed Falls prevention discussed     Functional Status Survey: Is the patient deaf or have difficulty hearing?: No Does the patient have difficulty seeing, even when wearing glasses/contacts?: No Does the patient have difficulty concentrating, remembering, or making decisions?: No Does the patient have difficulty walking or climbing stairs?: No Does the patient have difficulty dressing or bathing?: No Does the patient have difficulty doing errands alone such as visiting a doctor's office or shopping?: No   Assessment & Plan  1. Well adult exam  - COMPLETE METABOLIC PANEL  WITH GFR - CBC with Differential/Platelet - VITAMIN D 25 Hydroxy (Vit-D Deficiency, Fractures) - Lipid panel - MM 3D SCREEN BREAST BILATERAL; Future - DG Bone Density; Future  2. Breast cancer screening by mammogram  - MM 3D SCREEN BREAST BILATERAL; Future  3. Osteopenia after menopause  - COMPLETE METABOLIC PANEL WITH GFR - CBC with Differential/Platelet - VITAMIN D 25 Hydroxy (Vit-D Deficiency, Fractures) - DG Bone Density; Future  4. Dyslipidemia  - Lipid panel   5. Need for pneumococcal 20-valent conjugate vaccination  - Pneumococcal conjugate vaccine 20-valent (Prevnar 20)  6. Breast lump on right side at 11 o'clock position  - MM Digital Diagnostic Bilat; Future - US BREAST LTD UNI RIGHT INC AXILLA; Future - US BREAST LTD UNI LEFT INC AXILLA; Future  -USPSTF grade A and B recommendations reviewed with patient; age-appropriate recommendations, preventive care, screening tests, etc discussed and encouraged; healthy living encouraged; see AVS for patient education given to patient -Discussed importance of 150 minutes of physical activity weekly, eat two servings of fish weekly, eat one serving of tree nuts ( cashews, pistachios, pecans, almonds.Marland Kitchen) every other day, eat 6 servings of fruit/vegetables daily and drink plenty of water and avoid sweet beverages.   -Reviewed Health Maintenance: Yes.

## 2022-03-05 NOTE — Patient Instructions (Signed)
Preventive Care 65 Years and Older, Female Preventive care refers to lifestyle choices and visits with your health care provider that can promote health and wellness. Preventive care visits are also called wellness exams. What can I expect for my preventive care visit? Counseling Your health care provider may ask you questions about your: Medical history, including: Past medical problems. Family medical history. Pregnancy and menstrual history. History of falls. Current health, including: Memory and ability to understand (cognition). Emotional well-being. Home life and relationship well-being. Sexual activity and sexual health. Lifestyle, including: Alcohol, nicotine or tobacco, and drug use. Access to firearms. Diet, exercise, and sleep habits. Work and work environment. Sunscreen use. Safety issues such as seatbelt and bike helmet use. Physical exam Your health care provider will check your: Height and weight. These may be used to calculate your BMI (body mass index). BMI is a measurement that tells if you are at a healthy weight. Waist circumference. This measures the distance around your waistline. This measurement also tells if you are at a healthy weight and may help predict your risk of certain diseases, such as type 2 diabetes and high blood pressure. Heart rate and blood pressure. Body temperature. Skin for abnormal spots. What immunizations do I need?  Vaccines are usually given at various ages, according to a schedule. Your health care provider will recommend vaccines for you based on your age, medical history, and lifestyle or other factors, such as travel or where you work. What tests do I need? Screening Your health care provider may recommend screening tests for certain conditions. This may include: Lipid and cholesterol levels. Hepatitis C test. Hepatitis B test. HIV (human immunodeficiency virus) test. STI (sexually transmitted infection) testing, if you are at  risk. Lung cancer screening. Colorectal cancer screening. Diabetes screening. This is done by checking your blood sugar (glucose) after you have not eaten for a while (fasting). Mammogram. Talk with your health care provider about how often you should have regular mammograms. BRCA-related cancer screening. This may be done if you have a family history of breast, ovarian, tubal, or peritoneal cancers. Bone density scan. This is done to screen for osteoporosis. Talk with your health care provider about your test results, treatment options, and if necessary, the need for more tests. Follow these instructions at home: Eating and drinking  Eat a diet that includes fresh fruits and vegetables, whole grains, lean protein, and low-fat dairy products. Limit your intake of foods with high amounts of sugar, saturated fats, and salt. Take vitamin and mineral supplements as recommended by your health care provider. Do not drink alcohol if your health care provider tells you not to drink. If you drink alcohol: Limit how much you have to 0-1 drink a day. Know how much alcohol is in your drink. In the [place] one drink equals one 12 oz bottle of beer (355 mL), one 5 oz glass of wine (148 mL), or one 1 oz glass of hard liquor (44 mL). Lifestyle Brush your teeth every morning and night with fluoride toothpaste. Floss one time each day. Exercise for at least 30 minutes 5 or more days each week. Do not use any products that contain nicotine or tobacco. These products include cigarettes, chewing tobacco, and vaping devices, such as e-cigarettes. If you need help quitting, ask your health care provider. Do not use drugs. If you are sexually active, practice safe sex. Use a condom or other form of protection in order to prevent STIs. Take aspirin only as told by   your health care provider. Make sure that you understand how much to take and what form to take. Work with your health care provider to find out whether it  is safe and beneficial for you to take aspirin daily. Ask your health care provider if you need to take a cholesterol-lowering medicine (statin). Find healthy ways to manage stress, such as: Meditation, yoga, or listening to music. Journaling. Talking to a trusted person. Spending time with friends and family. Minimize exposure to UV radiation to reduce your risk of skin cancer. Safety Always wear your seat belt while driving or riding in a vehicle. Do not drive: If you have been drinking alcohol. Do not ride with someone who has been drinking. When you are tired or distracted. While texting. If you have been using any mind-altering substances or drugs. Wear a helmet and other protective equipment during sports activities. If you have firearms in your house, make sure you follow all gun safety procedures. What's next? Visit your health care provider once a year for an annual wellness visit. Ask your health care provider how often you should have your eyes and teeth checked. Stay up to date on all vaccines. This information is not intended to replace advice given to you by your health care provider. Make sure you discuss any questions you have with your health care provider. Document Revised: 12/24/2020 Document Reviewed: 12/24/2020 Elsevier Patient Education  2023 Elsevier Inc.  

## 2022-03-06 LAB — URINE CULTURE
MICRO NUMBER:: 13832667
SPECIMEN QUALITY:: ADEQUATE

## 2022-03-08 ENCOUNTER — Encounter: Payer: Self-pay | Admitting: Family Medicine

## 2022-03-08 ENCOUNTER — Ambulatory Visit (INDEPENDENT_AMBULATORY_CARE_PROVIDER_SITE_OTHER): Payer: No Typology Code available for payment source | Admitting: Family Medicine

## 2022-03-08 VITALS — BP 138/80 | HR 84 | Temp 98.0°F | Resp 16 | Ht 62.0 in | Wt 112.9 lb

## 2022-03-08 DIAGNOSIS — Z1231 Encounter for screening mammogram for malignant neoplasm of breast: Secondary | ICD-10-CM

## 2022-03-08 DIAGNOSIS — E785 Hyperlipidemia, unspecified: Secondary | ICD-10-CM

## 2022-03-08 DIAGNOSIS — N6311 Unspecified lump in the right breast, upper outer quadrant: Secondary | ICD-10-CM | POA: Diagnosis not present

## 2022-03-08 DIAGNOSIS — M8580 Other specified disorders of bone density and structure, unspecified site: Secondary | ICD-10-CM

## 2022-03-08 DIAGNOSIS — Z23 Encounter for immunization: Secondary | ICD-10-CM | POA: Diagnosis not present

## 2022-03-08 DIAGNOSIS — E559 Vitamin D deficiency, unspecified: Secondary | ICD-10-CM | POA: Diagnosis not present

## 2022-03-08 DIAGNOSIS — Z780 Asymptomatic menopausal state: Secondary | ICD-10-CM | POA: Diagnosis not present

## 2022-03-08 DIAGNOSIS — Z0000 Encounter for general adult medical examination without abnormal findings: Secondary | ICD-10-CM

## 2022-03-08 DIAGNOSIS — R5383 Other fatigue: Secondary | ICD-10-CM | POA: Diagnosis not present

## 2022-03-08 DIAGNOSIS — I700 Atherosclerosis of aorta: Secondary | ICD-10-CM | POA: Diagnosis not present

## 2022-03-08 LAB — CERVICOVAGINAL ANCILLARY ONLY
Bacterial Vaginitis (gardnerella): NEGATIVE
Chlamydia: NEGATIVE
Comment: NEGATIVE
Comment: NEGATIVE
Comment: NEGATIVE
Comment: NORMAL
Neisseria Gonorrhea: NEGATIVE
Trichomonas: NEGATIVE

## 2022-03-09 ENCOUNTER — Telehealth: Payer: Self-pay | Admitting: Family Medicine

## 2022-03-09 LAB — VITAMIN D 25 HYDROXY (VIT D DEFICIENCY, FRACTURES): Vit D, 25-Hydroxy: 30 ng/mL (ref 30–100)

## 2022-03-09 LAB — CBC WITH DIFFERENTIAL/PLATELET
Absolute Monocytes: 302 cells/uL (ref 200–950)
Basophils Absolute: 42 cells/uL (ref 0–200)
Basophils Relative: 1 %
Eosinophils Absolute: 172 cells/uL (ref 15–500)
Eosinophils Relative: 4.1 %
HCT: 44.4 % (ref 35.0–45.0)
Hemoglobin: 15 g/dL (ref 11.7–15.5)
Lymphs Abs: 1378 cells/uL (ref 850–3900)
MCH: 31.8 pg (ref 27.0–33.0)
MCHC: 33.8 g/dL (ref 32.0–36.0)
MCV: 94.3 fL (ref 80.0–100.0)
MPV: 11.2 fL (ref 7.5–12.5)
Monocytes Relative: 7.2 %
Neutro Abs: 2306 cells/uL (ref 1500–7800)
Neutrophils Relative %: 54.9 %
Platelets: 246 10*3/uL (ref 140–400)
RBC: 4.71 10*6/uL (ref 3.80–5.10)
RDW: 11.9 % (ref 11.0–15.0)
Total Lymphocyte: 32.8 %
WBC: 4.2 10*3/uL (ref 3.8–10.8)

## 2022-03-09 LAB — LIPID PANEL
Cholesterol: 200 mg/dL — ABNORMAL HIGH (ref <200)
HDL: 73 mg/dL (ref >=50)
LDL Cholesterol (Calc): 110 mg/dL (calc) — ABNORMAL HIGH
Non-HDL Cholesterol (Calc): 127 mg/dL (calc) (ref <130)
Total CHOL/HDL Ratio: 2.7 (calc) (ref <5.0)
Triglycerides: 81 mg/dL (ref <150)

## 2022-03-09 LAB — COMPLETE METABOLIC PANEL WITH GFR
AG Ratio: 1.9 (calc) (ref 1.0–2.5)
ALT: 13 U/L (ref 6–29)
AST: 18 U/L (ref 10–35)
Albumin: 4.7 g/dL (ref 3.6–5.1)
Alkaline phosphatase (APISO): 71 U/L (ref 37–153)
BUN: 12 mg/dL (ref 7–25)
CO2: 26 mmol/L (ref 20–32)
Calcium: 9.8 mg/dL (ref 8.6–10.4)
Chloride: 105 mmol/L (ref 98–110)
Creat: 0.75 mg/dL (ref 0.50–1.05)
Globulin: 2.5 g/dL (calc) (ref 1.9–3.7)
Glucose, Bld: 91 mg/dL (ref 65–99)
Potassium: 5 mmol/L (ref 3.5–5.3)
Sodium: 141 mmol/L (ref 135–146)
Total Bilirubin: 0.7 mg/dL (ref 0.2–1.2)
Total Protein: 7.2 g/dL (ref 6.1–8.1)
eGFR: 88 mL/min/{1.73_m2} (ref >=60)

## 2022-03-09 NOTE — Telephone Encounter (Unsigned)
Copied from [place] [id number]. Topic: General - Other >> Mar 09, 2022  8:58 AM Ludger Nutting wrote: Patient called to let pcp know she moved up her breast imaging appointment to 03/26/2022.

## 2022-03-12 ENCOUNTER — Telehealth: Payer: Self-pay | Admitting: Family Medicine

## 2022-03-12 NOTE — Telephone Encounter (Signed)
Copied from [place] [id number]. Topic: Referral - Status >> Mar 12, 2022  3:06 PM Devoria Glassing wrote: Reason for CRM: [facility] needs to know what type of sleep study you want he pt to have [phone] Fax: [phone]

## 2022-03-26 ENCOUNTER — Ambulatory Visit
Admission: RE | Admit: 2022-03-26 | Discharge: 2022-03-26 | Disposition: A | Discharge status: Home / Self Care | Payer: No Typology Code available for payment source | Source: Ambulatory Visit | Attending: Family Medicine | Admitting: Family Medicine

## 2022-03-26 DIAGNOSIS — N6311 Unspecified lump in the right breast, upper outer quadrant: Secondary | ICD-10-CM

## 2022-03-26 DIAGNOSIS — N6489 Other specified disorders of breast: Secondary | ICD-10-CM | POA: Diagnosis not present

## 2022-03-26 DIAGNOSIS — R928 Other abnormal and inconclusive findings on diagnostic imaging of breast: Secondary | ICD-10-CM | POA: Diagnosis not present

## 2022-03-29 ENCOUNTER — Other Ambulatory Visit: Payer: Self-pay

## 2022-04-19 ENCOUNTER — Other Ambulatory Visit: Payer: No Typology Code available for payment source

## 2022-04-22 ENCOUNTER — Ambulatory Visit: Payer: No Typology Code available for payment source | Admitting: Family Medicine
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
## 2022-04-30 NOTE — Patient Instructions (Signed)
Preventive Care 65 Years and Older, Female Preventive care refers to lifestyle choices and visits with your health care provider that can promote health and wellness. Preventive care visits are also called wellness exams. What can I expect for my preventive care visit? Counseling Your health care provider may ask you questions about your: Medical history, including: Past medical problems. Family medical history. Pregnancy and menstrual history. History of falls. Current health, including: Memory and ability to understand (cognition). Emotional well-being. Home life and relationship well-being. Sexual activity and sexual health. Lifestyle, including: Alcohol, nicotine or tobacco, and drug use. Access to firearms. Diet, exercise, and sleep habits. Work and work environment. Sunscreen use. Safety issues such as seatbelt and bike helmet use. Physical exam Your health care provider will check your: Height and weight. These may be used to calculate your BMI (body mass index). BMI is a measurement that tells if you are at a healthy weight. Waist circumference. This measures the distance around your waistline. This measurement also tells if you are at a healthy weight and may help predict your risk of certain diseases, such as type 2 diabetes and high blood pressure. Heart rate and blood pressure. Body temperature. Skin for abnormal spots. What immunizations do I need?  Vaccines are usually given at various ages, according to a schedule. Your health care provider will recommend vaccines for you based on your age, medical history, and lifestyle or other factors, such as travel or where you work. What tests do I need? Screening Your health care provider may recommend screening tests for certain conditions. This may include: Lipid and cholesterol levels. Hepatitis C test. Hepatitis B test. HIV (human immunodeficiency virus) test. STI (sexually transmitted infection) testing, if you are at  risk. Lung cancer screening. Colorectal cancer screening. Diabetes screening. This is done by checking your blood sugar (glucose) after you have not eaten for a while (fasting). Mammogram. Talk with your health care provider about how often you should have regular mammograms. BRCA-related cancer screening. This may be done if you have a family history of breast, ovarian, tubal, or peritoneal cancers. Bone density scan. This is done to screen for osteoporosis. Talk with your health care provider about your test results, treatment options, and if necessary, the need for more tests. Follow these instructions at home: Eating and drinking  Eat a diet that includes fresh fruits and vegetables, whole grains, lean protein, and low-fat dairy products. Limit your intake of foods with high amounts of sugar, saturated fats, and salt. Take vitamin and mineral supplements as recommended by your health care provider. Do not drink alcohol if your health care provider tells you not to drink. If you drink alcohol: Limit how much you have to 0-1 drink a day. Know how much alcohol is in your drink. In the [place] one drink equals one 12 oz bottle of beer (355 mL), one 5 oz glass of wine (148 mL), or one 1 oz glass of hard liquor (44 mL). Lifestyle Brush your teeth every morning and night with fluoride toothpaste. Floss one time each day. Exercise for at least 30 minutes 5 or more days each week. Do not use any products that contain nicotine or tobacco. These products include cigarettes, chewing tobacco, and vaping devices, such as e-cigarettes. If you need help quitting, ask your health care provider. Do not use drugs. If you are sexually active, practice safe sex. Use a condom or other form of protection in order to prevent STIs. Take aspirin only as told by   your health care provider. Make sure that you understand how much to take and what form to take. Work with your health care provider to find out whether it  is safe and beneficial for you to take aspirin daily. Ask your health care provider if you need to take a cholesterol-lowering medicine (statin). Find healthy ways to manage stress, such as: Meditation, yoga, or listening to music. Journaling. Talking to a trusted person. Spending time with friends and family. Minimize exposure to UV radiation to reduce your risk of skin cancer. Safety Always wear your seat belt while driving or riding in a vehicle. Do not drive: If you have been drinking alcohol. Do not ride with someone who has been drinking. When you are tired or distracted. While texting. If you have been using any mind-altering substances or drugs. Wear a helmet and other protective equipment during sports activities. If you have firearms in your house, make sure you follow all gun safety procedures. What's next? Visit your health care provider once a year for an annual wellness visit. Ask your health care provider how often you should have your eyes and teeth checked. Stay up to date on all vaccines. This information is not intended to replace advice given to you by your health care provider. Make sure you discuss any questions you have with your health care provider. Document Revised: 12/24/2020 Document Reviewed: 12/24/2020 Elsevier Patient Education  2023 Elsevier Inc.  

## 2022-05-03 ENCOUNTER — Ambulatory Visit (INDEPENDENT_AMBULATORY_CARE_PROVIDER_SITE_OTHER): Payer: No Typology Code available for payment source | Admitting: Family Medicine

## 2022-05-03 ENCOUNTER — Encounter: Payer: Self-pay | Admitting: Family Medicine

## 2022-05-03 VITALS — BP 140/80 | HR 79 | Temp 98.1°F | Resp 14 | Ht 62.0 in | Wt 110.0 lb

## 2022-05-03 DIAGNOSIS — M8580 Other specified disorders of bone density and structure, unspecified site: Secondary | ICD-10-CM | POA: Diagnosis not present

## 2022-05-03 DIAGNOSIS — Z0000 Encounter for general adult medical examination without abnormal findings: Secondary | ICD-10-CM | POA: Diagnosis not present

## 2022-05-03 DIAGNOSIS — Z780 Asymptomatic menopausal state: Secondary | ICD-10-CM
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
## 2022-05-21 ENCOUNTER — Encounter: Payer: 59 | Admitting: Family Medicine

## 2022-05-25 DIAGNOSIS — H43811 Vitreous degeneration, right eye: Secondary | ICD-10-CM | POA: Diagnosis not present

## 2022-06-09 DIAGNOSIS — H43813 Vitreous degeneration, bilateral: Secondary | ICD-10-CM | POA: Diagnosis not present

## 2022-07-13 ENCOUNTER — Other Ambulatory Visit: Payer: No Typology Code available for payment source

## 2022-07-23 ENCOUNTER — Ambulatory Visit
Admission: RE | Admit: 2022-07-23 | Discharge: 2022-07-23 | Disposition: A | Discharge status: Home / Self Care | Payer: No Typology Code available for payment source | Source: Ambulatory Visit | Attending: Family Medicine | Admitting: Family Medicine

## 2022-07-23 DIAGNOSIS — M8589 Other specified disorders of bone density and structure, multiple sites: Secondary | ICD-10-CM | POA: Diagnosis not present

## 2022-07-23 DIAGNOSIS — Z780 Asymptomatic menopausal state: Secondary | ICD-10-CM | POA: Insufficient documentation

## 2022-07-23 DIAGNOSIS — Z0000 Encounter for general adult medical examination without abnormal findings: Secondary | ICD-10-CM | POA: Diagnosis not present

## 2022-07-23 DIAGNOSIS — M8580 Other specified disorders of bone density and structure, unspecified site: Secondary | ICD-10-CM | POA: Insufficient documentation

## 2023-03-09 NOTE — Progress Notes (Unsigned)
Name: Wendy Ashley   MRN: [id number]    DOB: [birth year]   Date:03/10/2023       Progress Note  Subjective  Chief Complaint  CPE  HPI  Patient presents for annual CPE.  Diet: balanced diet  Exercise: discussed increase in physical activity  Last Eye Exam: up to date  Last Dental Exam: up to date   [employer] Visit from 03/10/2023 in [facility]  AUDIT-C Score 1      Depression: Phq 9 is  negative    03/10/2023    8:34 AM 05/03/2022    9:23 AM 03/08/2022   10:19 AM 03/05/2022   10:14 AM 12/03/2021   10:55 AM  Depression screen PHQ 2/9  Decreased Interest 0 0 0 0 0  Down, Depressed, Hopeless 0 0 0 0 0  PHQ - 2 Score 0 0 0 0 0  Altered sleeping 0 0 0 0 0  Tired, decreased energy 0 0 0 0 0  Change in appetite 0 0 0 0 0  Feeling bad or failure about yourself  0 0 0 0 0  Trouble concentrating 0 0 0 0 0  Moving slowly or fidgety/restless 0 0 0 0 0  Suicidal thoughts 0 0 0 0 0  PHQ-9 Score 0 0 0 0 0  Difficult doing work/chores Not difficult at all  Not difficult at all     Hypertension: BP Readings from Last 3 Encounters:  03/10/23 136/84  05/03/22 (!) 140/80  03/08/22 138/80   Obesity: Wt Readings from Last 3 Encounters:  03/10/23 111 lb 14.4 oz (50.8 kg)  05/03/22 110 lb (49.9 kg)  03/08/22 112 lb 14.4 oz (51.2 kg)   BMI Readings from Last 3 Encounters:  03/10/23 20.80 kg/m  05/03/22 20.12 kg/m  03/08/22 20.65 kg/m     Vaccines:   RSV: discussed with patient  Tdap: 11/04/2015 Shingrix: Completed Pneumonia: Completed Flu: 05/03/2022 COVID-19:Completed 2 doses   Hep C Screening: Completed STD testing and prevention (HIV/chl/gon/syphilis): N/A Intimate partner violence: negative screen  Sexual History : one partner , no pain during sex  Menstrual History/LMP/Abnormal Bleeding: pos-menopausal  Discussed importance of follow up if any post-menopausal bleeding: yes  Incontinence Symptoms: negative for symptoms    Breast cancer:  - Last Mammogram: 03/26/2022 - BRCA gene screening: N/A  Osteoporosis Prevention : Discussed high calcium and vitamin D supplementation, weight bearing exercises Bone density :yes   Cervical cancer screening: N/A  Skin cancer: Discussed monitoring for atypical lesions  Colorectal cancer: 01/15/2019   Lung cancer:  Low Dose CT Chest recommended if Age 92-80 years, 20 pack-year currently smoking OR have quit w/in 15years. Patient does not qualify for screen   ECG: 01/18/2022  Advanced Care Planning: A voluntary discussion about advance care planning including the explanation and discussion of advance directives.  Discussed health care proxy and Living will, and the patient was able to identify a health care proxy as husband .  Patient does not have a living will and power of attorney of health care   Lipids: Lab Results  Component Value Date   CHOL 200 (H) 03/08/2022   CHOL 222 (H) 05/20/2021   CHOL 204 (H) 05/15/2020   Lab Results  Component Value Date   HDL 73 03/08/2022   HDL 71 05/20/2021   HDL 67 05/15/2020   Lab Results  Component Value Date   LDLCALC 110 (H) 03/08/2022   LDLCALC 135 (H) 05/20/2021   LDLCALC 119 (H)  05/15/2020   Lab Results  Component Value Date   TRIG 81 03/08/2022   TRIG 67 05/20/2021   TRIG 81 05/15/2020   Lab Results  Component Value Date   CHOLHDL 2.7 03/08/2022   CHOLHDL 3.1 05/20/2021   CHOLHDL 3.0 05/15/2020   No results found for: "LDLDIRECT"  Glucose: Glucose  Date Value Ref Range Status  06/04/2013 101 (H) 65 - 99 mg/dL Final   Glucose, Bld  Date Value Ref Range Status  03/08/2022 91 65 - 99 mg/dL Final    Comment:    .            Fasting reference interval .   01/18/2022 93 70 - 99 mg/dL Final    Comment:    Glucose reference range applies only to samples taken after fasting for at least 8 hours.  11/06/2021 100 (H) 70 - 99 mg/dL Final    Comment:    Glucose reference range applies only to samples  taken after fasting for at least 8 hours.    Patient Active Problem List   Diagnosis Date Noted   Atherosclerosis of aorta (HCC) 05/15/2020   Hip pain, chronic, left 02/16/2017   Personal history of colonic polyps    Benign neoplasm of transverse colon    Lung nodule < 6cm on CT 08/19/2015   Chronic constipation 01/22/2015   GERD (gastroesophageal reflux disease) 01/22/2015   Osteopenia 01/22/2015    Past Surgical History:  Procedure Laterality Date   ABDOMINAL SURGERY     Tummy tuck   COLONOSCOPY WITH PROPOFOL N/A 12/22/2015   Procedure: COLONOSCOPY WITH PROPOFOL;  Surgeon: Midge Minium, MD;  Location: [facility];  Service: Endoscopy;  Laterality: N/A;   COLONOSCOPY WITH PROPOFOL N/A 01/15/2019   Procedure: COLONOSCOPY WITH PROPOFOL;  Surgeon: Midge Minium, MD;  Location: [facility];  Service: Endoscopy;  Laterality: N/A;   CYSTOSCOPY WITH BIOPSY N/A 09/17/2015   Procedure: CYSTOSCOPY WITH BIOPSY;  Surgeon: Hildred Laser, MD;  Location: [facility];  Service: Urology;  Laterality: N/A;   CYSTOSCOPY WITH FULGERATION N/A 09/17/2015   Procedure: CYSTOSCOPY WITH FULGERATION;  Surgeon: Hildred Laser, MD;  Location: [facility];  Service: Urology;  Laterality: N/A;   POLYPECTOMY  12/22/2015   Procedure: POLYPECTOMY;  Surgeon: Midge Minium, MD;  Location: [facility];  Service: Endoscopy;;   TUBAL LIGATION      Family History  Problem Relation Age of Onset   Hypertension Mother    Heart Problems Mother    Alzheimer's disease Father    Cancer Father        colon   Hypertension Sister    Hypertension Sister    Hypertension Brother    Breast cancer Neg Hx     Social History   Socioeconomic History   Marital status: Married    Spouse name: Jed Limerick   Number of children: 3   Years of education: Not on file   Highest education level: Bachelor's degree (e.g., BA, AB, BS)  Occupational History   Occupation: unemployed  Tobacco Use   Smoking status:  Never   Smokeless tobacco: Never  Vaping Use   Vaping status: Never Used  Substance and Sexual Activity   Alcohol use: Yes    Alcohol/week: 2.0 standard drinks of alcohol    Types: 2 Glasses of wine per week    Comment: on occasion   Drug use: No   Sexual activity: Yes    Partners: Male  Other Topics Concern  Not on file  Social History Narrative   Originally from [place], moved to [place] with husband from up [place].    Social Determinants of Health   Financial Resource Strain: Low Risk  (03/10/2023)   Overall Financial Resource Strain (CARDIA)    Difficulty of Paying Living Expenses: Not hard at all  Food Insecurity: No Food Insecurity (03/10/2023)   Hunger Vital Sign    Worried About Running Out of Food in the Last Year: Never true    Ran Out of Food in the Last Year: Never true  Transportation Needs: No Transportation Needs (03/10/2023)   PRAPARE - Administrator, Civil Service (Medical): No    Lack of Transportation (Non-Medical): No  Physical Activity: Insufficiently Active (03/10/2023)   Exercise Vital Sign    Days of Exercise per Week: 3 days    Minutes of Exercise per Session: 30 min  Stress: No Stress Concern Present (03/10/2023)   [employer] of Occupational Health - Occupational Stress Questionnaire    Feeling of Stress : Not at all  Social Connections: Moderately Integrated (03/10/2023)   Social Connection and Isolation Panel [NHANES]    Frequency of Communication with Friends and Family: More than three times a week    Frequency of Social Gatherings with Friends and Family: More than three times a week    Attends Religious Services: More than 4 times per year    Active Member of [employer] or Organizations: No    Attends Banker Meetings: Never    Marital Status: Married  Catering manager Violence: Not At Risk (03/10/2023)   Humiliation, Afraid, Rape, and Kick questionnaire    Fear of Current or Ex-Partner: No    Emotionally Abused: No     Physically Abused: No    Sexually Abused: No     Current Outpatient Medications:    Cholecalciferol (VITAMIN D) 50 MCG (2000 UT) CAPS, Take 1 capsule by mouth daily at 12 noon., Disp: , Rfl:    Multiple Vitamin (MULTIVITAMIN) tablet, Take 1 tablet by mouth daily., Disp: , Rfl:   Allergies  Allergen Reactions   Tylenol [Acetaminophen] Swelling    Lips   Ciprofloxacin Swelling and Rash    LIPS AND THROAT     ROS  Constitutional: Negative for fever or weight change.  Respiratory: Negative for cough and shortness of breath.   Cardiovascular: Negative for chest pain or palpitations.  Gastrointestinal: Negative for abdominal pain, no bowel changes.  Musculoskeletal: Negative for gait problem or joint swelling.  Skin: Negative for rash.  Neurological: Negative for dizziness or headache.  No other specific complaints in a complete review of systems (except as listed in HPI above).   Objective  Vitals:   03/10/23 0841  BP: 136/84  Pulse: 71  Resp: 16  Temp: (!) 97.4 F (36.3 C)  TempSrc: Oral  SpO2: 99%  Weight: 111 lb 14.4 oz (50.8 kg)  Height: 5' 1.5" (1.562 m)    Body mass index is 20.8 kg/m.  Physical Exam  Constitutional: Patient appears well-developed and well-nourished. No distress.  HENT: Head: Normocephalic and atraumatic. Ears: B TMs ok, no erythema or effusion; Nose: Nose normal. Mouth/Throat: Oropharynx is clear and moist. No oropharyngeal exudate.  Eyes: Conjunctivae and EOM are normal. Pupils are equal, round, and reactive to light. No scleral icterus.  Neck: Normal range of motion. Neck supple. No JVD present. No thyromegaly present.  Cardiovascular: Normal rate, regular rhythm and normal heart sounds.  No murmur heard. No  BLE edema. Pulmonary/Chest: Effort normal and breath sounds normal. No respiratory distress. Abdominal: Soft. Bowel sounds are normal, no distension. There is no tenderness. 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 No gross deformities Neurological: he is alert and oriented to person, place, and time. No cranial nerve deficit. Coordination, balance, strength, speech and gait are normal.  Skin: Skin is warm and dry. No rash noted. No erythema.  Psychiatric: Patient has a normal mood and affect. behavior is normal. Judgment and thought content normal.    Fall Risk:    03/10/2023    8:33 AM 05/03/2022    9:23 AM 03/08/2022   10:18 AM 03/05/2022   10:13 AM 12/03/2021   10:55 AM  Fall Risk   Falls in the past year? 0 0 0 0 0  Number falls in past yr: 0  0 0 0  Injury with Fall? 0  0 0 0  Risk for fall due to : No Fall Risks No Fall Risks No Fall Risks No Fall Risks No Fall Risks  Follow up Falls prevention discussed;Education provided;Falls evaluation completed Falls prevention discussed;Education provided;Falls evaluation completed Falls prevention discussed;Education provided Falls prevention discussed Falls prevention discussed     Functional Status Survey: Is the patient deaf or have difficulty hearing?: No Does the patient have difficulty seeing, even when wearing glasses/contacts?: No Does the patient have difficulty concentrating, remembering, or making decisions?: No Does the patient have difficulty walking or climbing stairs?: No Does the patient have difficulty dressing or bathing?: No Does the patient have difficulty doing errands alone such as visiting a doctor's office or shopping?: No   Assessment & Plan  1. Well adult exam   2. Breast cancer screening by mammogram  - MM 3D SCREENING MAMMOGRAM BILATERAL BREAST; Future  3. Cervical cancer  screening  - Cytology - PAP  4. Atherosclerosis of aorta (HCC)  - Lipid panel  5. Dyslipidemia  - Lipid panel  6. Vitamin D deficiency  - VITAMIN D 25 Hydroxy (Vit-D Deficiency, Fractures)  7. Hypertension, unspecified type  - COMPLETE METABOLIC PANEL WITH GFR - CBC with Differential/Platelet   -USPSTF grade A and B recommendations reviewed with patient; age-appropriate recommendations, preventive care, screening tests, etc discussed and encouraged; healthy living encouraged; see AVS for patient education given to patient -Discussed importance of 150 minutes of physical activity weekly, eat two servings of fish weekly, eat one serving of tree nuts ( cashews, pistachios, pecans, almonds.Marland Kitchen) every other day, eat 6 servings of fruit/vegetables daily and drink plenty of water and avoid sweet beverages.   -Reviewed Health Maintenance: Yes.

## 2023-03-10 ENCOUNTER — Other Ambulatory Visit (HOSPITAL_COMMUNITY)
Admission: RE | Admit: 2023-03-10 | Discharge: 2023-03-10 | Disposition: A | Discharge status: Home / Self Care | Payer: No Typology Code available for payment source | Source: Ambulatory Visit | Attending: Family Medicine | Admitting: Family Medicine

## 2023-03-10 ENCOUNTER — Encounter: Payer: Self-pay | Admitting: Family Medicine

## 2023-03-10 ENCOUNTER — Ambulatory Visit (INDEPENDENT_AMBULATORY_CARE_PROVIDER_SITE_OTHER): Payer: No Typology Code available for payment source | Admitting: Family Medicine

## 2023-03-10 VITALS — BP 136/84 | HR 71 | Temp 97.4°F | Resp 16 | Ht 61.5 in | Wt 111.9 lb

## 2023-03-10 DIAGNOSIS — Z1151 Encounter for screening for human papillomavirus (HPV): Secondary | ICD-10-CM | POA: Insufficient documentation

## 2023-03-10 DIAGNOSIS — Z124 Encounter for screening for malignant neoplasm of cervix: Secondary | ICD-10-CM

## 2023-03-10 DIAGNOSIS — I700 Atherosclerosis of aorta: Secondary | ICD-10-CM

## 2023-03-10 DIAGNOSIS — Z01419 Encounter for gynecological examination (general) (routine) without abnormal findings: Secondary | ICD-10-CM | POA: Diagnosis not present

## 2023-03-10 DIAGNOSIS — Z1231 Encounter for screening mammogram for malignant neoplasm of breast: Secondary | ICD-10-CM

## 2023-03-10 DIAGNOSIS — E559 Vitamin D deficiency, unspecified: Secondary | ICD-10-CM

## 2023-03-10 DIAGNOSIS — Z0000 Encounter for general adult medical examination without abnormal findings: Secondary | ICD-10-CM | POA: Diagnosis not present

## 2023-03-10 DIAGNOSIS — E785 Hyperlipidemia, unspecified: Secondary | ICD-10-CM

## 2023-03-10 DIAGNOSIS — I10 Essential (primary) hypertension: Secondary | ICD-10-CM

## 2023-03-11 LAB — COMPLETE METABOLIC PANEL WITH GFR
AG Ratio: 1.8 (calc) (ref 1.0–2.5)
ALT: 13 U/L (ref 6–29)
AST: 20 U/L (ref 10–35)
Albumin: 4.7 g/dL (ref 3.6–5.1)
Alkaline phosphatase (APISO): 75 U/L (ref 37–153)
BUN: 12 mg/dL (ref 7–25)
CO2: 28 mmol/L (ref 20–32)
Calcium: 9.5 mg/dL (ref 8.6–10.4)
Chloride: 99 mmol/L (ref 98–110)
Creat: 0.67 mg/dL (ref 0.50–1.05)
Globulin: 2.6 g/dL (ref 1.9–3.7)
Glucose, Bld: 82 mg/dL (ref 65–99)
Potassium: 3.6 mmol/L (ref 3.5–5.3)
Sodium: 138 mmol/L (ref 135–146)
Total Bilirubin: 0.8 mg/dL (ref 0.2–1.2)
Total Protein: 7.3 g/dL (ref 6.1–8.1)
eGFR: 96 mL/min/{1.73_m2} (ref >=60)

## 2023-03-11 LAB — CBC WITH DIFFERENTIAL/PLATELET
Absolute Monocytes: 479 {cells}/uL (ref 200–950)
Basophils Absolute: 41 {cells}/uL (ref 0–200)
Basophils Relative: 0.8 %
Eosinophils Absolute: 179 {cells}/uL (ref 15–500)
Eosinophils Relative: 3.5 %
HCT: 44.9 % (ref 35.0–45.0)
Hemoglobin: 15.2 g/dL (ref 11.7–15.5)
Lymphs Abs: 1408 {cells}/uL (ref 850–3900)
MCH: 31.8 pg (ref 27.0–33.0)
MCHC: 33.9 g/dL (ref 32.0–36.0)
MCV: 93.9 fL (ref 80.0–100.0)
MPV: 11.4 fL (ref 7.5–12.5)
Monocytes Relative: 9.4 %
Neutro Abs: 2994 {cells}/uL (ref 1500–7800)
Neutrophils Relative %: 58.7 %
Platelets: 253 10*3/uL (ref 140–400)
RBC: 4.78 10*6/uL (ref 3.80–5.10)
RDW: 11.6 % (ref 11.0–15.0)
Total Lymphocyte: 27.6 %
WBC: 5.1 10*3/uL (ref 3.8–10.8)

## 2023-03-11 LAB — LIPID PANEL
Cholesterol: 211 mg/dL — ABNORMAL HIGH (ref <200)
HDL: 77 mg/dL (ref >=50)
LDL Cholesterol (Calc): 112 mg/dL — ABNORMAL HIGH
Non-HDL Cholesterol (Calc): 134 mg/dL — ABNORMAL HIGH (ref <130)
Total CHOL/HDL Ratio: 2.7 (calc) (ref <5.0)
Triglycerides: 111 mg/dL (ref <150)

## 2023-03-11 LAB — VITAMIN D 25 HYDROXY (VIT D DEFICIENCY, FRACTURES): Vit D, 25-Hydroxy: 71 ng/mL (ref 30–100)

## 2023-03-17 LAB — CYTOLOGY - PAP
Comment: NEGATIVE
Diagnosis: NEGATIVE
High risk HPV: NEGATIVE

## 2023-03-24 ENCOUNTER — Telehealth: Payer: Self-pay | Admitting: Family Medicine

## 2023-03-24 NOTE — Telephone Encounter (Signed)
Please reach out to pt and reschedule her medicare wellness visit that was scheduled with Dr Carlynn Purl for 05/10/2023. Pt has been informed via mychart that it has been cancelled. Thank you

## 2023-05-10 ENCOUNTER — Ambulatory Visit: Payer: No Typology Code available for payment source | Admitting: Family Medicine

## 2023-05-26 ENCOUNTER — Ambulatory Visit: Payer: No Typology Code available for payment source

## 2023-05-26 DIAGNOSIS — Z0000 Encounter for general adult medical examination without abnormal findings: Secondary | ICD-10-CM | POA: Diagnosis not present
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
## 2023-05-26 NOTE — Patient Instructions (Addendum)
Wendy Ashley , Thank you for taking time to come for your Medicare Wellness Visit. I appreciate your ongoing commitment to your health goals. Please review the following plan we discussed and let me know if I can assist you in the future.   Referrals/Orders/Follow-Ups/Clinician Recommendations: none  This is a list of the screening recommended for you and due dates:  Health Maintenance  Topic Date Due   COVID-19 Vaccine (3 - 2023-24 season) 03/13/2023   Mammogram  03/27/2023   Flu Shot  10/10/2023*   Colon Cancer Screening  01/15/2024   Medicare Annual Wellness Visit  05/25/2024   DTaP/Tdap/Td vaccine (3 - Td or Tdap) 11/03/2025   Pneumonia Vaccine  Completed   DEXA scan (bone density measurement)  Completed   Hepatitis C Screening  Completed   Zoster (Shingles) Vaccine  Completed   HPV Vaccine  Aged Out  *Topic was postponed. The date shown is not the original due date.    Advanced directives: (ACP Link)Information on Advanced Care Planning can be found at [facility] Directives Advance Health Care Directives ([email or web address]   Next Medicare Annual Wellness Visit scheduled for next year: Yes   06/14/24 @ 1:10 pm by video

## 2023-07-12 ENCOUNTER — Ambulatory Visit: Payer: No Typology Code available for payment source | Admitting: Family Medicine

## 2023-09-15 ENCOUNTER — Encounter: Payer: Self-pay | Admitting: Nurse Practitioner

## 2023-09-15 ENCOUNTER — Ambulatory Visit
Admission: RE | Admit: 2023-09-15 | Discharge: 2023-09-15 | Disposition: A | Discharge status: Home / Self Care | Attending: Nurse Practitioner | Admitting: Nurse Practitioner

## 2023-09-15 ENCOUNTER — Ambulatory Visit
Admission: RE | Admit: 2023-09-15 | Discharge: 2023-09-15 | Disposition: A | Discharge status: Home / Self Care | Source: Ambulatory Visit | Attending: Nurse Practitioner | Admitting: Nurse Practitioner

## 2023-09-15 ENCOUNTER — Ambulatory Visit: Admitting: Nurse Practitioner

## 2023-09-15 VITALS — BP 132/80 | HR 100 | Temp 97.9°F | Resp 16 | Ht 61.5 in | Wt 112.4 lb

## 2023-09-15 DIAGNOSIS — R051 Acute cough: Secondary | ICD-10-CM | POA: Diagnosis not present

## 2023-09-15 DIAGNOSIS — J0110 Acute frontal sinusitis, unspecified: Secondary | ICD-10-CM

## 2023-09-15 DIAGNOSIS — R059 Cough, unspecified: Secondary | ICD-10-CM | POA: Diagnosis not present

## 2023-09-15 DIAGNOSIS — R509 Fever, unspecified: Secondary | ICD-10-CM | POA: Diagnosis not present

## 2023-09-15 DIAGNOSIS — R918 Other nonspecific abnormal finding of lung field: Secondary | ICD-10-CM | POA: Diagnosis not present

## 2023-09-15 DIAGNOSIS — H66002 Acute suppurative otitis media without spontaneous rupture of ear drum, left ear: Secondary | ICD-10-CM

## 2023-09-15 MED ORDER — PROMETHAZINE-DM 6.25-15 MG/5ML PO SYRP: 5.0000 mL | ORAL_SOLUTION | Freq: Four times a day (QID) | ORAL | 0 refills | Status: DC | PRN

## 2023-09-15 MED ORDER — BENZONATATE 100 MG PO CAPS: 200.0000 mg | ORAL_CAPSULE | Freq: Three times a day (TID) | ORAL | 0 refills | Status: DC | PRN

## 2023-09-15 MED ORDER — AMOXICILLIN-POT CLAVULANATE 875-125 MG PO TABS: 1.0000 | ORAL_TABLET | Freq: Two times a day (BID) | ORAL | 0 refills | Status: DC

## 2023-09-15 NOTE — Progress Notes (Signed)
 BP 132/80   Pulse 100   Temp 97.9 F (36.6 C)   Resp 16   Ht 5' 1.5" (1.562 m)   Wt 112 lb 6.4 oz (51 kg)   LMP  (LMP Unknown)   SpO2 93%   BMI 20.89 kg/m    Subjective:    Patient ID: Wendy Ashley, female    DOB: [birth year], 69 y.o.   MRN: [id number]  HPI: Wendy Ashley is a 68 y.o. female  Chief Complaint  Patient presents with   URI    Onset 2 weeks with fever congestion cough and chest pain w/ cough    Discussed the use of AI scribe software for clinical note transcription with the patient, who gave verbal consent to proceed.  History of Present Illness   Wendy Ashley is a 68 year old female who presents with a two-week history of fever, congestion, and cough with blood-tinged sputum.  She has been experiencing a high fever and increased congestion for the past two weeks. Despite using over-the-counter cough medicines, she has not found significant relief. She is unable to take acetaminophen due to an allergy.  She describes occasional coughing that is painful and produces blood-tinged sputum. No pain is experienced upon palpation of the chest area. She denies any other family members being sick and confirms that she is the only one experiencing these symptoms.       05/26/2023   12:58 PM 03/10/2023    8:34 AM 05/03/2022    9:23 AM  Depression screen PHQ 2/9  Decreased Interest 0 0 0  Down, Depressed, Hopeless 0 0 0  PHQ - 2 Score 0 0 0  Altered sleeping 0 0 0  Tired, decreased energy 0 0 0  Change in appetite 0 0 0  Feeling bad or failure about yourself  0 0 0  Trouble concentrating 0 0 0  Moving slowly or fidgety/restless 0 0 0  Suicidal thoughts 0 0 0  PHQ-9 Score 0 0 0  Difficult doing work/chores Not difficult at all Not difficult at all     Relevant past medical, surgical, family and social history reviewed and updated as indicated. Interim medical history since our last visit reviewed. Allergies and medications reviewed and updated.  Review of  Systems  Ten systems reviewed and is negative except as mentioned in HPI       Objective:    BP 132/80   Pulse 100   Temp 97.9 F (36.6 C)   Resp 16   Ht 5' 1.5" (1.562 m)   Wt 112 lb 6.4 oz (51 kg)   LMP  (LMP Unknown)   SpO2 93%   BMI 20.89 kg/m    Wt Readings from Last 3 Encounters:  09/15/23 112 lb 6.4 oz (51 kg)  03/10/23 111 lb 14.4 oz (50.8 kg)  05/03/22 110 lb (49.9 kg)    Physical Exam Vitals reviewed.  Constitutional:      Appearance: Normal appearance.  HENT:     Head: Normocephalic.     Right Ear: Tympanic membrane normal.     Left Ear: Tympanic membrane is erythematous and bulging.     Nose: Congestion present.     Right Sinus: Maxillary sinus tenderness and frontal sinus tenderness present.     Left Sinus: Maxillary sinus tenderness and frontal sinus tenderness present.     Mouth/Throat:     Mouth: Mucous membranes are moist.     Pharynx: Oropharynx is clear. No oropharyngeal exudate  or posterior oropharyngeal erythema.  Eyes:     Extraocular Movements: Extraocular movements intact.     Conjunctiva/sclera: Conjunctivae normal.     Pupils: Pupils are equal, round, and reactive to light.  Cardiovascular:     Rate and Rhythm: Normal rate and regular rhythm.  Pulmonary:     Effort: Pulmonary effort is normal.     Breath sounds: Rhonchi present.  Lymphadenopathy:     Cervical: No cervical adenopathy.  Skin:    General: Skin is warm and dry.  Neurological:     General: No focal deficit present.     Mental Status: She is alert and oriented to person, place, and time. Mental status is at baseline.  Psychiatric:        Mood and Affect: Mood normal.        Behavior: Behavior normal.        Thought Content: Thought content normal.        Judgment: Judgment normal.     Results for orders placed or performed in visit on 03/10/23  Cytology - PAP   Collection Time: 03/10/23  9:24 AM  Result Value Ref Range   High risk HPV Negative    Adequacy       Satisfactory for evaluation. The presence or absence of an   Adequacy      endocervical/transformation zone component cannot be determined because   Adequacy of atrophy.    Diagnosis      - Negative for intraepithelial lesion or malignancy (NILM)   Comment Normal Reference Range HPV - Negative   Lipid panel   Collection Time: 03/10/23  9:37 AM  Result Value Ref Range   Cholesterol 211 (H) <200 mg/dL   HDL 77 > OR = 50 mg/dL   Triglycerides 098 <119 mg/dL   LDL Cholesterol (Calc) 112 (H) mg/dL (calc)   Total CHOL/HDL Ratio 2.7 <5.0 (calc)   Non-HDL Cholesterol (Calc) 134 (H) <130 mg/dL (calc)  COMPLETE METABOLIC PANEL WITH GFR   Collection Time: 03/10/23  9:37 AM  Result Value Ref Range   Glucose, Bld 82 65 - 99 mg/dL   BUN 12 7 - 25 mg/dL   Creat 1.47 8.29 - 5.62 mg/dL   eGFR 96 > OR = 60 ZH/YQM/5.78I6   BUN/Creatinine Ratio SEE NOTE: 6 - 22 (calc)   Sodium 138 135 - 146 mmol/L   Potassium 3.6 3.5 - 5.3 mmol/L   Chloride 99 98 - 110 mmol/L   CO2 28 20 - 32 mmol/L   Calcium 9.5 8.6 - 10.4 mg/dL   Total Protein 7.3 6.1 - 8.1 g/dL   Albumin 4.7 3.6 - 5.1 g/dL   Globulin 2.6 1.9 - 3.7 g/dL (calc)   AG Ratio 1.8 1.0 - 2.5 (calc)   Total Bilirubin 0.8 0.2 - 1.2 mg/dL   Alkaline phosphatase (APISO) 75 37 - 153 U/L   AST 20 10 - 35 U/L   ALT 13 6 - 29 U/L  CBC with Differential/Platelet   Collection Time: 03/10/23  9:37 AM  Result Value Ref Range   WBC 5.1 3.8 - 10.8 Thousand/uL   RBC 4.78 3.80 - 5.10 Million/uL   Hemoglobin 15.2 11.7 - 15.5 g/dL   HCT 96.2 95.2 - 84.1 %   MCV 93.9 80.0 - 100.0 fL   MCH 31.8 27.0 - 33.0 pg   MCHC 33.9 32.0 - 36.0 g/dL   RDW 32.4 40.1 - 02.7 %   Platelets 253 140 - 400 Thousand/uL   MPV  11.4 7.5 - 12.5 fL   Neutro Abs 2,994 1,500 - 7,800 cells/uL   Lymphs Abs 1,408 850 - 3,900 cells/uL   Absolute Monocytes 479 200 - 950 cells/uL   Eosinophils Absolute 179 15 - 500 cells/uL   Basophils Absolute 41 0 - 200 cells/uL   Neutrophils Relative %  58.7 %   Total Lymphocyte 27.6 %   Monocytes Relative 9.4 %   Eosinophils Relative 3.5 %   Basophils Relative 0.8 %  VITAMIN D 25 Hydroxy (Vit-D Deficiency, Fractures)   Collection Time: 03/10/23  9:37 AM  Result Value Ref Range   Vit D, 25-Hydroxy 71 30 - 100 ng/mL       Assessment & Plan:   Problem List Items Addressed This Visit   None Visit Diagnoses       Acute cough    -  Primary   Relevant Medications   benzonatate (TESSALON PERLES) 100 MG capsule   promethazine-dextromethorphan (PROMETHAZINE-DM) 6.25-15 MG/5ML syrup   Other Relevant Orders   DG Chest 2 View     Acute non-recurrent frontal sinusitis       Relevant Medications   amoxicillin-clavulanate (AUGMENTIN) 875-125 MG tablet   benzonatate (TESSALON PERLES) 100 MG capsule   promethazine-dextromethorphan (PROMETHAZINE-DM) 6.25-15 MG/5ML syrup     Non-recurrent acute suppurative otitis media of left ear without spontaneous rupture of tympanic membrane       Relevant Medications   amoxicillin-clavulanate (AUGMENTIN) 875-125 MG tablet        Assessment and Plan    Suspected Pneumonia/cough Differential diagnosis includes pneumonia due to symptom duration and presentation. Chest x-ray needed to confirm diagnosis and assess need for additional antibiotics. - Order chest x-ray. - Prescribe Augmentin twice daily for ten days. - Prescribe Tessalon Pearls for cough suppression. - Prescribe cough syrup phenergan dm for symptomatic relief, noting potential drowsiness. - Advise use of Flonase, Zyrtec, or Claritin as needed. - Recommend Mucinex, increased fluid intake, and rest.  Left Otitis Media/sinus infection Left ear infection requires antibiotic treatment. Augmentin will address both ear and potential lung infection. - Prescribe Augmentin twice daily for ten days. - Prescribe Tessalon Pearls for cough suppression. - Prescribe cough syrup phenergan dm for symptomatic relief, noting potential drowsiness. - Advise  use of Flonase, Zyrtec, or Claritin as needed. - Recommend Mucinex, increased fluid intake, and rest.        Follow up plan: Return if symptoms worsen or fail to improve.

## 2023-11-21 ENCOUNTER — Encounter: Payer: Self-pay | Admitting: Family Medicine

## 2023-11-21 ENCOUNTER — Ambulatory Visit: Payer: Self-pay

## 2023-11-21 ENCOUNTER — Ambulatory Visit (INDEPENDENT_AMBULATORY_CARE_PROVIDER_SITE_OTHER): Admitting: Family Medicine

## 2023-11-21 VITALS — BP 134/76 | HR 85 | Temp 97.7°F | Resp 16 | Ht 61.5 in | Wt 110.5 lb

## 2023-11-21 DIAGNOSIS — R630 Anorexia: Secondary | ICD-10-CM

## 2023-11-21 DIAGNOSIS — R519 Headache, unspecified: Secondary | ICD-10-CM | POA: Diagnosis not present

## 2023-11-21 DIAGNOSIS — K219 Gastro-esophageal reflux disease without esophagitis: Secondary | ICD-10-CM | POA: Diagnosis not present

## 2023-11-21 DIAGNOSIS — R5383 Other fatigue: Secondary | ICD-10-CM

## 2023-11-21 DIAGNOSIS — M542 Cervicalgia: Secondary | ICD-10-CM | POA: Diagnosis not present

## 2023-11-21 MED ORDER — BACLOFEN 10 MG PO TABS: 10.0000 mg | ORAL_TABLET | Freq: Three times a day (TID) | ORAL | 0 refills | Status: AC

## 2023-11-21 MED ORDER — OMEPRAZOLE 40 MG PO CPDR: 40.0000 mg | DELAYED_RELEASE_CAPSULE | Freq: Every day | ORAL | 0 refills | Status: DC

## 2023-11-21 NOTE — Telephone Encounter (Signed)
 Copied from CRM [id number]. Topic: Clinical - Red Word Triage >> Nov 21, 2023 11:54 AM Zipporah Him wrote: Red Word that prompted transfer to Nurse Triage: feeling very tired, wear, there is a spot in her throat that has been hurting at least 3 weeks   Chief Complaint: Sore throat  Symptoms: Sore throat, fatigue  Pertinent Negatives: Patient denies Cough, runny nose, fever Disposition: [] ED /[] Urgent Care (no appt availability in office) / [x] Appointment(In office/virtual)/ []  [facility]/ [] Home Care/ [] Refused Recommended Disposition /[] [facility]/ []  Follow-up with PCP Additional Notes: Patient reports that she has had a pain in her throat for over a month. She states that her pain has is 6/10 and does not believe it is strep throat. She states she has also been experiencing some fatigue with her pain. She denies any cough, runny nose, fever, or other symptoms at this time. Appointment made for today for evaluation.    Reason for Disposition  [1] Sore throat is the only symptom AND [2] present > 48 hours  Answer Assessment - Initial Assessment Questions 1. ONSET: "When did the throat start hurting?" (Hours or days ago)      Over a month  2. SEVERITY: "How bad is the sore throat?" (Scale 1-10; mild, moderate or severe)   - MILD (1-3):  Doesn't interfere with eating or normal activities.   - MODERATE (4-7): Interferes with eating some solids and normal activities.   - SEVERE (8-10):  Excruciating pain, interferes with most normal activities.   - SEVERE WITH DYSPHAGIA (10): Can't swallow liquids, drooling.     6/10 3. STREP EXPOSURE: "Has there been any exposure to strep within the past week?" If Yes, ask: "What type of contact occurred?"      No 4.  VIRAL SYMPTOMS: "Are there any symptoms of a cold, such as a runny nose, cough, hoarse voice or red eyes?"      No 5. FEVER: "Do you have a fever?" If Yes, ask: "What is your temperature, how was it measured, and when did  it start?"     No 6. PUS ON THE TONSILS: "Is there pus on the tonsils in the back of your throat?"     No 7. OTHER SYMPTOMS: "Do you have any other symptoms?" (e.g., difficulty breathing, headache, rash)     Fatigue  Protocols used: Sore Throat-A-AH

## 2023-11-21 NOTE — Progress Notes (Signed)
 Name: Wendy Ashley   MRN: [id number]    DOB: 05-17-1956   Date:11/21/2023       Progress Note  Subjective  Chief Complaint  Chief Complaint  Patient presents with   throat problem    Pain no matter if patient is eating/drinking, comes and goes   Fatigue    Discussed the use of AI scribe software for clinical note transcription with the patient, who gave verbal consent to proceed.  History of Present Illness Wendy Ashley is a 68 year old female who presents with neck pain and new onset headaches.  She has been experiencing neck pain for over a month, described as a dull ache located on the anterior neck, extending from below the chin to the beginning of the chest. The pain is continuous and occurs daily, but it does not worsen with touch, eating, drinking, or breathing. The pain 'comes and goes' on its own and is not associated with fever, chills, cough, wheezing, heartburn, or indigestion. She has a past history of reflux, but the current neck discomfort does not feel the same as her previous reflux symptoms.  She reports new onset headaches for about a month, which she has never experienced before. The headaches are located on the left side of her head, particularly in the parietal area, and are described as bothersome but not intense. Occasionally, she experiences transient vision changes and dizziness with the headaches. The headaches are not constant and come and go. She sometimes experiences nausea but no vomiting. She takes Tylenol or ibuprofen for relief when the headaches are severe.  She mentions feeling fatigued with no energy and a decreased appetite, although her weight has remained stable since August. No changes in diet or stomach pain. In her family history, she recalls a cousin with thyroid  disease.    Patient Active Problem List   Diagnosis Date Noted   Atherosclerosis of aorta (HCC) 05/15/2020   Hip pain, chronic, left 02/16/2017   History of colonic polyps    Benign  neoplasm of transverse colon    Lung nodule < 6cm on CT 08/19/2015   Chronic constipation 01/22/2015   GERD (gastroesophageal reflux disease) 01/22/2015   Osteopenia 01/22/2015    Social History   Tobacco Use   Smoking status: Never   Smokeless tobacco: Never  Substance Use Topics   Alcohol use: Yes    Alcohol/week: 2.0 standard drinks of alcohol    Types: 2 Glasses of wine per week    Comment: on occasion     Current Outpatient Medications:    baclofen (LIORESAL) 10 MG tablet, Take 1 tablet (10 mg total) by mouth 3 (three) times daily., Disp: 30 each, Rfl: 0   Cholecalciferol (VITAMIN D ) 50 MCG (2000 UT) CAPS, Take 1 capsule by mouth daily at 12 noon., Disp: , Rfl:    Multiple Vitamin (MULTIVITAMIN) tablet, Take 1 tablet by mouth daily., Disp: , Rfl:    omeprazole (PRILOSEC) 40 MG capsule, Take 1 capsule (40 mg total) by mouth daily., Disp: 30 capsule, Rfl: 0  Allergies  Allergen Reactions   Tylenol [Acetaminophen] Swelling    Lips   Ciprofloxacin  Swelling and Rash    LIPS AND THROAT    ROS  Ten systems reviewed and is negative except as mentioned in HPI    Objective  Vitals:   11/21/23 1459  BP: 134/76  Pulse: 85  Resp: 16  Temp: 97.7 F (36.5 C)  TempSrc: Oral  SpO2: 100%  Weight: 110 lb  8 oz (50.1 kg)  Height: 5' 1.5" (1.562 m)    Body mass index is 20.54 kg/m.  Physical Exam  CONSTITUTIONAL: Patient appears well-developed and well-nourished. No distress. HEENT: Head atraumatic, normocephalic, neck supple. Neck non-tender to palpation. Trachea midline, no tenderness. CARDIOVASCULAR: Normal rate, regular rhythm and normal heart sounds. No murmur heard. No BLE edema. PULMONARY: Effort normal and breath sounds normal. No respiratory distress. ABDOMINAL: There is no tenderness or distention. Abdomen non-tender to palpation. MUSCULOSKELETAL: Normal gait. Without gross motor or sensory deficit. PSYCHIATRIC: Patient has a normal mood and affect. Behavior  is normal. Judgment and thought content normal. NEUROLOGICAL: Cranial nerves grossly intact. Temporal artery non-tender.  Assessment & Plan New onset headache with dizziness and vision changes New headache in left parietal area with dizziness and transient vision changes. Atypical for age, requires evaluation to rule out serious causes. CT indicated. - Order CT of the head. - Prescribe muscle relaxers at night. - Refer to neurologist if CT and labs are normal. - Advise immediate medical attention for severe headache, new weakness, or significant vision changes.  Anterior neck discomfort Intermittent dull ache in anterior neck. Differential includes esophagitis, thyroiditis, or reflux. Plan to rule out thyroiditis and assess inflammation. - Check thyroid  function tests. - Check C-reactive protein and sedimentation rate. - Prescribe 40 mg proton pump inhibitor daily before largest meal.  Gastroesophageal reflux disease (GERD) GERD managed with medication. Current neck discomfort may relate to esophagitis. Proton pump inhibitor trial planned. - Prescribe 40 mg proton pump inhibitor daily before largest meal.  Fatigue and lack of appetite Fatigue and decreased appetite without significant weight loss. Possible thyroid  dysfunction or post-viral syndrome. Blood work planned. - Check CBC and comprehensive metabolic panel. - Check thyroid  function tests.

## 2023-11-22 ENCOUNTER — Ambulatory Visit: Payer: Self-pay | Admitting: Family Medicine

## 2023-11-22 LAB — COMPREHENSIVE METABOLIC PANEL WITH GFR
AG Ratio: 2.1 (calc) (ref 1.0–2.5)
ALT: 14 U/L (ref 6–29)
AST: 19 U/L (ref 10–35)
Albumin: 4.8 g/dL (ref 3.6–5.1)
Alkaline phosphatase (APISO): 72 U/L (ref 37–153)
BUN: 12 mg/dL (ref 7–25)
CO2: 29 mmol/L (ref 20–32)
Calcium: 9.9 mg/dL (ref 8.6–10.4)
Chloride: 102 mmol/L (ref 98–110)
Creat: 0.74 mg/dL (ref 0.50–1.05)
Globulin: 2.3 g/dL (ref 1.9–3.7)
Glucose, Bld: 99 mg/dL (ref 65–99)
Potassium: 4.4 mmol/L (ref 3.5–5.3)
Sodium: 138 mmol/L (ref 135–146)
Total Bilirubin: 0.8 mg/dL (ref 0.2–1.2)
Total Protein: 7.1 g/dL (ref 6.1–8.1)
eGFR: 89 mL/min/{1.73_m2} (ref >=60)

## 2023-11-22 LAB — CBC WITH DIFFERENTIAL/PLATELET
Absolute Lymphocytes: 1596 {cells}/uL (ref 850–3900)
Absolute Monocytes: 469 {cells}/uL (ref 200–950)
Basophils Absolute: 41 {cells}/uL (ref 0–200)
Basophils Relative: 0.8 %
Eosinophils Absolute: 102 {cells}/uL (ref 15–500)
Eosinophils Relative: 2 %
HCT: 43 % (ref 35.0–45.0)
Hemoglobin: 14.5 g/dL (ref 11.7–15.5)
MCH: 31 pg (ref 27.0–33.0)
MCHC: 33.7 g/dL (ref 32.0–36.0)
MCV: 92.1 fL (ref 80.0–100.0)
MPV: 11.4 fL (ref 7.5–12.5)
Monocytes Relative: 9.2 %
Neutro Abs: 2892 {cells}/uL (ref 1500–7800)
Neutrophils Relative %: 56.7 %
Platelets: 249 10*3/uL (ref 140–400)
RBC: 4.67 10*6/uL (ref 3.80–5.10)
RDW: 12.6 % (ref 11.0–15.0)
Total Lymphocyte: 31.3 %
WBC: 5.1 10*3/uL (ref 3.8–10.8)

## 2023-11-22 LAB — SEDIMENTATION RATE: Sed Rate: 6 mm/h (ref 0–30)

## 2023-11-22 LAB — TSH+FREE T4: TSH W/REFLEX TO FT4: 1.36 m[IU]/L (ref 0.40–4.50)

## 2023-11-22 LAB — C-REACTIVE PROTEIN: CRP: <3 mg/L (ref <8.0)

## 2023-11-23 ENCOUNTER — Ambulatory Visit
Admission: RE | Admit: 2023-11-23 | Discharge: 2023-11-23 | Disposition: A | Discharge status: Home / Self Care | Source: Ambulatory Visit | Attending: Family Medicine | Admitting: Family Medicine

## 2023-11-23 DIAGNOSIS — R519 Headache, unspecified: Secondary | ICD-10-CM | POA: Diagnosis not present

## 2023-11-23 DIAGNOSIS — R42 Dizziness and giddiness: Secondary | ICD-10-CM | POA: Diagnosis not present

## 2023-11-23 MED ORDER — IOHEXOL 300 MG/ML  SOLN
75.0000 mL | Freq: Once | INTRAMUSCULAR | Status: AC | PRN
  Administered 2023-11-23: 75 mL via INTRAVENOUS

## 2023-11-28 ENCOUNTER — Other Ambulatory Visit: Payer: Self-pay | Admitting: Family Medicine

## 2023-11-28 ENCOUNTER — Other Ambulatory Visit: Payer: Self-pay

## 2023-11-28 DIAGNOSIS — R519 Headache, unspecified: Secondary | ICD-10-CM

## 2023-12-27 ENCOUNTER — Ambulatory Visit: Admitting: Family Medicine

## 2023-12-29 ENCOUNTER — Encounter: Payer: Self-pay | Admitting: Family Medicine

## 2023-12-29 ENCOUNTER — Ambulatory Visit (INDEPENDENT_AMBULATORY_CARE_PROVIDER_SITE_OTHER): Admitting: Family Medicine

## 2023-12-29 VITALS — BP 136/78 | HR 88 | Resp 16 | Ht 61.5 in | Wt 108.9 lb

## 2023-12-29 DIAGNOSIS — K219 Gastro-esophageal reflux disease without esophagitis: Secondary | ICD-10-CM

## 2023-12-29 DIAGNOSIS — R519 Headache, unspecified: Secondary | ICD-10-CM | POA: Diagnosis not present

## 2023-12-29 MED ORDER — OMEPRAZOLE 40 MG PO CPDR: 40.0000 mg | DELAYED_RELEASE_CAPSULE | Freq: Every day | ORAL | 1 refills | Status: AC

## 2023-12-29 MED ORDER — TOPIRAMATE 25 MG PO TABS: 25.0000 mg | ORAL_TABLET | Freq: Every evening | ORAL | 0 refills | Status: DC

## 2023-12-29 NOTE — Patient Instructions (Signed)
 Referral has been sent to:   [facility] NEUROLOGY DEPT   P: [phone] F: [phone]   Release ID # [id number]

## 2023-12-29 NOTE — Progress Notes (Signed)
 Name: Wendy Ashley   MRN: [id number]    DOB: Nov 25, 1955   Date:12/29/2023       Progress Note  Subjective  Chief Complaint  Chief Complaint  Patient presents with   Medical Management of Chronic Issues    HA Still having some but not as often as previously     Discussed the use of AI scribe software for clinical note transcription with the patient, who gave verbal consent to proceed.  History of Present Illness Wendy Ashley is a 68 year old female who presents with new onset headaches.  She has been experiencing significant and bothersome headaches for the past month, occurring daily and primarily located on the left side of her head, particularly in the parietal area. These headaches are sometimes accompanied by vision issues, dizziness, and nausea, but no vomiting. She has no history of migraines.  A CT scan of her head was performed, and the results were normal. She was previously prescribed baclofen  for potential tension headaches, but no other headache-specific medications were given.  She has been taking omeprazole  for a month to address reflux symptoms, which she associates with a burning sensation in her neck and front. The medication has helped, although not completely alleviating the symptoms. She continues to experience some burning pain, which she attributes to reflux.     Patient Active Problem List   Diagnosis Date Noted   Atherosclerosis of aorta (HCC) 05/15/2020   Hip pain, chronic, left 02/16/2017   History of colonic polyps    Benign neoplasm of transverse colon    Lung nodule < 6cm on CT 08/19/2015   Chronic constipation 01/22/2015   GERD (gastroesophageal reflux disease) 01/22/2015   Osteopenia 01/22/2015    Social History   Tobacco Use   Smoking status: Never   Smokeless tobacco: Never  Substance Use Topics   Alcohol use: Yes    Alcohol/week: 2.0 standard drinks of alcohol    Types: 2 Glasses of wine per week    Comment: on occasion     Current  Outpatient Medications:    baclofen  (LIORESAL ) 10 MG tablet, Take 1 tablet (10 mg total) by mouth 3 (three) times daily., Disp: 30 each, Rfl: 0   Cholecalciferol (VITAMIN D ) 50 MCG (2000 UT) CAPS, Take 1 capsule by mouth daily at 12 noon., Disp: , Rfl:    Multiple Vitamin (MULTIVITAMIN) tablet, Take 1 tablet by mouth daily., Disp: , Rfl:    omeprazole  (PRILOSEC) 40 MG capsule, Take 1 capsule (40 mg total) by mouth daily., Disp: 30 capsule, Rfl: 0  Allergies  Allergen Reactions   Tylenol [Acetaminophen] Swelling    Lips   Ciprofloxacin  Swelling and Rash    LIPS AND THROAT    ROS  Ten systems reviewed and is negative except as mentioned in HPI    Objective  Vitals:   12/29/23 1335  BP: 136/78  Pulse: 88  Resp: 16  SpO2: 99%  Weight: 108 lb 14.4 oz (49.4 kg)  Height: 5' 1.5 (1.562 m)    Body mass index is 20.24 kg/m.  Physical Exam VITALS: BP- 136/84 CONSTITUTIONAL: Patient appears well-developed and well-nourished.  No distress. HEENT: Head atraumatic, normocephalic, neck supple. CARDIOVASCULAR: Normal rate, regular rhythm and normal heart sounds.  No murmur heard. No BLE edema. PULMONARY: Effort normal and breath sounds normal. No respiratory distress. ABDOMINAL: There is no tenderness or distention. MUSCULOSKELETAL: Normal gait. Without gross motor or sensory deficit. PSYCHIATRIC: Patient has a normal mood and affect. behavior is  normal. Judgment and thought content normal.  Recent Results (from the past 2160 hours)  C-reactive protein     Status: None   Collection Time: 11/21/23  3:31 PM  Result Value Ref Range   CRP <3.0 <8.0 mg/L  CBC with Differential/Platelet     Status: None   Collection Time: 11/21/23  3:31 PM  Result Value Ref Range   WBC 5.1 3.8 - 10.8 Thousand/uL   RBC 4.67 3.80 - 5.10 Million/uL   Hemoglobin 14.5 11.7 - 15.5 g/dL   HCT 16.1 09.6 - 04.5 %   MCV 92.1 80.0 - 100.0 fL   MCH 31.0 27.0 - 33.0 pg   MCHC 33.7 32.0 - 36.0 g/dL    Comment:  For adults, a slight decrease in the calculated MCHC value (in the range of 30 to 32 g/dL) is most likely not clinically significant; however, it should be interpreted with caution in correlation with other red cell parameters and the patient's clinical condition.    RDW 12.6 11.0 - 15.0 %   Platelets 249 140 - 400 Thousand/uL   MPV 11.4 7.5 - 12.5 fL   Neutro Abs 2,892 1,500 - 7,800 cells/uL   Absolute Lymphocytes 1,596 850 - 3,900 cells/uL   Absolute Monocytes 469 200 - 950 cells/uL   Eosinophils Absolute 102 15 - 500 cells/uL   Basophils Absolute 41 0 - 200 cells/uL   Neutrophils Relative % 56.7 %   Total Lymphocyte 31.3 %   Monocytes Relative 9.2 %   Eosinophils Relative 2.0 %   Basophils Relative 0.8 %  Comprehensive metabolic panel with GFR     Status: None   Collection Time: 11/21/23  3:31 PM  Result Value Ref Range   Glucose, Bld 99 65 - 99 mg/dL    Comment: .            Fasting reference interval .    BUN 12 7 - 25 mg/dL   Creat 4.09 8.11 - 9.14 mg/dL   eGFR 89 > OR = 60 NW/GNF/6.21H0   BUN/Creatinine Ratio SEE NOTE: 6 - 22 (calc)    Comment:    Not Reported: BUN and Creatinine are within    reference range. .    Sodium 138 135 - 146 mmol/L   Potassium 4.4 3.5 - 5.3 mmol/L   Chloride 102 98 - 110 mmol/L   CO2 29 20 - 32 mmol/L   Calcium  9.9 8.6 - 10.4 mg/dL   Total Protein 7.1 6.1 - 8.1 g/dL   Albumin 4.8 3.6 - 5.1 g/dL   Globulin 2.3 1.9 - 3.7 g/dL (calc)   AG Ratio 2.1 1.0 - 2.5 (calc)   Total Bilirubin 0.8 0.2 - 1.2 mg/dL   Alkaline phosphatase (APISO) 72 37 - 153 U/L   AST 19 10 - 35 U/L   ALT 14 6 - 29 U/L  Sedimentation rate     Status: None   Collection Time: 11/21/23  3:31 PM  Result Value Ref Range   Sed Rate 6 0 - 30 mm/h  TSH + free T4     Status: None   Collection Time: 11/21/23  3:31 PM  Result Value Ref Range   TSH W/REFLEX TO FT4 1.36 0.40 - 4.50 mIU/L     Assessment & Plan New onset headaches Headaches primarily in the left  parietal area with nausea, visual disturbances, and dizziness. CT scan normal. Differential includes migraine. Headaches decreased in intensity but occur almost daily. Topamax chosen for cost-effectiveness and  efficacy. Discussed potential side effects and need for dose titration and neurology follow-up to rule out other causes  - Initiate Topamax, titrate from 25 mg to 100 mg at night as tolerated. - Educated on potential side effects of Topamax, including somnolence, paresthesia, and dysgeusia. - Refer to neurology for further evaluation and management. - Provided clinical summary and neurologist contact information for appointment scheduling.  Gastroesophageal reflux disease (GERD) GERD with persistent burning sensation and neck pain. Symptoms improved with omeprazole  but not fully resolved. Continued omeprazole  necessary to prevent recurrence. - Refill omeprazole  prescription for ongoing GERD management.

## 2024-02-27 DIAGNOSIS — K219 Gastro-esophageal reflux disease without esophagitis: Secondary | ICD-10-CM | POA: Diagnosis not present

## 2024-02-27 DIAGNOSIS — K5909 Other constipation: Secondary | ICD-10-CM | POA: Diagnosis not present

## 2024-02-27 DIAGNOSIS — R1032 Left lower quadrant pain: Secondary | ICD-10-CM | POA: Diagnosis not present

## 2024-02-27 DIAGNOSIS — R140 Abdominal distension (gaseous): Secondary | ICD-10-CM | POA: Diagnosis not present

## 2024-02-27 DIAGNOSIS — R1031 Right lower quadrant pain: Secondary | ICD-10-CM | POA: Diagnosis not present

## 2024-02-27 DIAGNOSIS — Z860101 Personal history of adenomatous and serrated colon polyps: Secondary | ICD-10-CM | POA: Diagnosis not present

## 2024-03-02 ENCOUNTER — Encounter

## 2024-03-08 ENCOUNTER — Other Ambulatory Visit: Payer: Self-pay

## 2024-03-09 ENCOUNTER — Ambulatory Visit
Admission: RE | Admit: 2024-03-09 | Discharge: 2024-03-09 | Disposition: A | Discharge status: Home / Self Care | Source: Ambulatory Visit | Attending: Family Medicine | Admitting: Family Medicine

## 2024-03-09 DIAGNOSIS — Z1231 Encounter for screening mammogram for malignant neoplasm of breast: Secondary | ICD-10-CM | POA: Diagnosis not present

## 2024-03-15 ENCOUNTER — Encounter: Payer: Self-pay | Admitting: Family Medicine

## 2024-03-15 ENCOUNTER — Ambulatory Visit (INDEPENDENT_AMBULATORY_CARE_PROVIDER_SITE_OTHER): Payer: Self-pay | Admitting: Family Medicine

## 2024-03-15 VITALS — BP 130/76 | HR 66 | Resp 16 | Ht 61.43 in | Wt 108.8 lb

## 2024-03-15 DIAGNOSIS — M8580 Other specified disorders of bone density and structure, unspecified site: Secondary | ICD-10-CM

## 2024-03-15 DIAGNOSIS — Z1231 Encounter for screening mammogram for malignant neoplasm of breast: Secondary | ICD-10-CM

## 2024-03-15 DIAGNOSIS — Z780 Asymptomatic menopausal state: Secondary | ICD-10-CM | POA: Diagnosis not present

## 2024-03-15 DIAGNOSIS — Z0000 Encounter for general adult medical examination without abnormal findings: Secondary | ICD-10-CM | POA: Diagnosis not present

## 2024-03-15 DIAGNOSIS — Z79899 Other long term (current) drug therapy: Secondary | ICD-10-CM

## 2024-03-15 DIAGNOSIS — E785 Hyperlipidemia, unspecified: Secondary | ICD-10-CM | POA: Diagnosis not present

## 2024-03-15 DIAGNOSIS — Z1211 Encounter for screening for malignant neoplasm of colon: Secondary | ICD-10-CM

## 2024-03-15 DIAGNOSIS — E559 Vitamin D deficiency, unspecified: Secondary | ICD-10-CM

## 2024-03-15 DIAGNOSIS — I700 Atherosclerosis of aorta: Secondary | ICD-10-CM | POA: Diagnosis not present

## 2024-03-15 NOTE — Progress Notes (Signed)
 Name: Wendy Ashley   MRN: [id number]    DOB: [birth year]   Date:03/15/2024       Progress Note  Subjective  Chief Complaint  Chief Complaint  Patient presents with   Annual Exam    HPI  Patient presents for annual CPE.  Diet: cook at home, balanced diet  Exercise: continue regular physical activity, walking 3 times a week and some weight training   Last Eye Exam: completed Last Dental Exam: completed  Flowsheet Row Office Visit from 03/15/2024 in [facility]  AUDIT-C Score 1   Depression: Phq 9 is  negative    03/15/2024    8:56 AM 12/29/2023    1:32 PM 11/21/2023    2:55 PM 05/26/2023   12:58 PM 03/10/2023    8:34 AM  Depression screen PHQ 2/9  Decreased Interest 0 0 0 0 0  Down, Depressed, Hopeless 0 0 0 0 0  PHQ - 2 Score 0 0 0 0 0  Altered sleeping    0 0  Tired, decreased energy    0 0  Change in appetite    0 0  Feeling bad or failure about yourself     0 0  Trouble concentrating    0 0  Moving slowly or fidgety/restless    0 0  Suicidal thoughts    0 0  PHQ-9 Score    0 0  Difficult doing work/chores    Not difficult at all Not difficult at all   Hypertension: BP Readings from Last 3 Encounters:  03/15/24 130/76  12/29/23 136/78  11/21/23 134/76   Obesity: Wt Readings from Last 3 Encounters:  03/15/24 108 lb 12.8 oz (49.4 kg)  12/29/23 108 lb 14.4 oz (49.4 kg)  11/21/23 110 lb 8 oz (50.1 kg)   BMI Readings from Last 3 Encounters:  03/15/24 20.27 kg/m  12/29/23 20.24 kg/m  11/21/23 20.54 kg/m     Vaccines: reviewed with the patient.   Hep C Screening: completed STD testing and prevention (HIV/chl/gon/syphilis): N/A Intimate partner violence: negative screen  Sexual History : no pain during intercourse  Menstrual History/LMP/Abnormal Bleeding: post menopausal  Discussed importance of follow up if any post-menopausal bleeding: yes  Incontinence Symptoms: positive for symptoms , she has noticed urge incontinence , she is  not interested on medication at this time   Breast cancer:  - Last Mammogram: up to date - BRCA gene screening: N/A  Osteoporosis Prevention : Discussed high calcium  and vitamin D  supplementation, weight bearing exercises Bone density :yes   Cervical cancer screening: up-to-date  Skin cancer: Discussed monitoring for atypical lesions  Colorectal cancer: scheduled for tomorrow    Lung cancer:  Low Dose CT Chest recommended if Age 32-80 years, 20 pack-year currently smoking OR have quit w/in 15years. Patient does not qualify for screen   ECG: 01/2022   Advanced Care Planning: A voluntary discussion about advance care planning including the explanation and discussion of advance directives.  Discussed health care proxy and Living will, and the patient was able to identify a health care proxy as husband .  Patient does have a living will and power of attorney of health care   Patient Active Problem List   Diagnosis Date Noted   Atherosclerosis of aorta (HCC) 05/15/2020   Hip pain, chronic, left 02/16/2017   History of colonic polyps    Benign neoplasm of transverse colon    Lung nodule < 6cm on CT 08/19/2015   Chronic constipation  01/22/2015   GERD (gastroesophageal reflux disease) 01/22/2015   Osteopenia 01/22/2015    Past Surgical History:  Procedure Laterality Date   ABDOMINAL SURGERY     Tummy tuck   COLONOSCOPY WITH PROPOFOL  N/A 12/22/2015   Procedure: COLONOSCOPY WITH PROPOFOL ;  Surgeon: Rogelia Copping, MD;  Location: [facility];  Service: Endoscopy;  Laterality: N/A;   COLONOSCOPY WITH PROPOFOL  N/A 01/15/2019   Procedure: COLONOSCOPY WITH PROPOFOL ;  Surgeon: Copping Rogelia, MD;  Location: [facility];  Service: Endoscopy;  Laterality: N/A;   CYSTOSCOPY WITH BIOPSY N/A 09/17/2015   Procedure: CYSTOSCOPY WITH BIOPSY;  Surgeon: Redell Lynwood Napoleon, MD;  Location: [facility] OR[facility];  Service: Urology;  Laterality: N/A;   CYSTOSCOPY WITH FULGERATION N/A 09/17/2015   Procedure:  CYSTOSCOPY WITH FULGERATION;  Surgeon: Redell Lynwood Napoleon, MD;  Location: [facility] OR[facility];  Service: Urology;  Laterality: N/A;   POLYPECTOMY  12/22/2015   Procedure: POLYPECTOMY;  Surgeon: Rogelia Copping, MD;  Location: [facility];  Service: Endoscopy;;   TUBAL LIGATION      Family History  Problem Relation Age of Onset   Hypertension Mother    Heart Problems Mother    Alzheimer's disease Father    Cancer Father        colon   Hypertension Sister    Hypertension Sister    Hypertension Brother    Breast cancer Neg Hx     Social History   Socioeconomic History   Marital status: Married    Spouse name: Lynda   Number of children: 3   Years of education: Not on file   Highest education level: Some college, no degree  Occupational History   Occupation: unemployed  Tobacco Use   Smoking status: Never   Smokeless tobacco: Never  Vaping Use   Vaping status: Never Used  Substance and Sexual Activity   Alcohol use: Yes    Alcohol/week: 2.0 standard drinks of alcohol    Types: 2 Glasses of wine per week    Comment: on occasion   Drug use: No   Sexual activity: Yes    Partners: Male  Other Topics Concern   Not on file  Social History Narrative   Originally from [place], moved to [place] with husband from [place].    Social Drivers of Corporate investment banker Strain: Low Risk  (02/27/2024)   Received from [facility]   Overall Financial Resource Strain (CARDIA)    Difficulty of Paying Living Expenses: Not hard at all  Food Insecurity: No Food Insecurity (02/27/2024)   Received from [facility]   Hunger Vital Sign    Within the past 12 months, you worried that your food would run out before you got the money to buy more.: Never true    Within the past 12 months, the food you bought just didn't last and you didn't have money to get more.: Never true  Transportation Needs: No Transportation Needs (02/27/2024)   Received from [facility] - Transportation    In the past 12 months, has lack of transportation kept you from medical appointments or from getting medications?: No    Lack of Transportation (Non-Medical): No  Physical Activity: Sufficiently Active (03/15/2024)   Exercise Vital Sign    Days of Exercise per Week: 3 days    Minutes of Exercise per Session: 60 min  Stress: No Stress Concern Present (03/15/2024)   [employer] of Occupational Health - Occupational Stress  Questionnaire    Feeling of Stress: Only a little  Social Connections: Moderately Isolated (03/15/2024)   Social Connection and Isolation Panel    Frequency of Communication with Friends and Family: More than three times a week    Frequency of Social Gatherings with Friends and Family: Once a week    Attends Religious Services: Never    Database administrator or Organizations: No    Attends Banker Meetings: Never    Marital Status: Married  Catering manager Violence: Not At Risk (03/15/2024)   Humiliation, Afraid, Rape, and Kick questionnaire    Fear of Current or Ex-Partner: No    Emotionally Abused: No    Physically Abused: No    Sexually Abused: No     Current Outpatient Medications:    Cholecalciferol (VITAMIN D ) 50 MCG (2000 UT) CAPS, Take 1 capsule by mouth daily at 12 noon., Disp: , Rfl:    Multiple Vitamin (MULTIVITAMIN) tablet, Take 1 tablet by mouth daily., Disp: , Rfl:    omeprazole  (PRILOSEC) 40 MG capsule, Take 1 capsule (40 mg total) by mouth daily., Disp: 90 capsule, Rfl: 1   baclofen  (LIORESAL ) 10 MG tablet, Take 1 tablet (10 mg total) by mouth 3 (three) times daily. (Patient not taking: Reported on 03/15/2024), Disp: 30 each, Rfl: 0  Allergies  Allergen Reactions   Tylenol [Acetaminophen] Swelling    Lips   Ciprofloxacin  Swelling and Rash    LIPS AND THROAT     ROS  Constitutional: Negative for fever or weight change.  Respiratory: Negative for cough and shortness of breath.    Cardiovascular: Negative for chest pain or palpitations.  Gastrointestinal: Negative for abdominal pain, no bowel changes.  Musculoskeletal: Negative for gait problem or joint swelling.  Skin: Negative for rash.  Neurological: Negative for dizziness , positive for intermittent  headache.  No other specific complaints in a complete review of systems (except as listed in HPI above).   Objective  Vitals:   03/15/24 0905  BP: 130/76  Pulse: 66  Resp: 16  SpO2: 99%  Weight: 108 lb 12.8 oz (49.4 kg)  Height: 5' 1.43 (1.56 m)    Body mass index is 20.27 kg/m.  Physical Exam  Constitutional: Patient appears well-developed and well-nourished. No distress.  HENT: Head: Normocephalic and atraumatic. Ears: B TMs ok, no erythema or effusion; Nose: Nose normal. Mouth/Throat: Oropharynx is clear and moist. No oropharyngeal exudate.  Eyes: Conjunctivae and EOM are normal. Pupils are equal, round, and reactive to light. No scleral icterus.  Neck: Normal range of motion. Neck supple. No JVD present. No thyromegaly present.  Cardiovascular: Normal rate, regular rhythm and normal heart sounds.  No murmur heard. No BLE edema. Pulmonary/Chest: Effort normal and breath sounds normal. No respiratory distress. Abdominal: Soft. Bowel sounds are normal, no distension. There is no tenderness. no masses Breast: not done FEMALE GENITALIA:  Not done  RECTAL: not done  Musculoskeletal: Normal range of motion, no joint effusions. No gross deformities Neurological: he is alert and oriented to person, place, and time. No cranial nerve deficit. Coordination, balance, strength, speech and gait are normal.  Skin: Skin is warm and dry. No rash noted. No erythema.  Psychiatric: Patient has a normal mood and affect. behavior is normal. Judgment and thought content normal.     Assessment & Plan  1. Well adult exam (Primary)   2. Atherosclerosis of aorta (HCC)  - Lipid panel  3. Dyslipidemia  - Lipid  panel - Comprehensive  metabolic panel with GFR  4. Vitamin D  deficiency  - VITAMIN D  25 Hydroxy (Vit-D Deficiency, Fractures)  5. Osteopenia after menopause  - DG Bone Density; Future  6. Long-term use of high-risk medication  - Comprehensive metabolic panel with GFR   -USPSTF grade A and B recommendations reviewed with patient; age-appropriate recommendations, preventive care, screening tests, etc discussed and encouraged; healthy living encouraged; see AVS for patient education given to patient -Discussed importance of 150 minutes of physical activity weekly, eat two servings of fish weekly, eat one serving of tree nuts ( cashews, pistachios, pecans, almonds.SABRA) every other day, eat 6 servings of fruit/vegetables daily and drink plenty of water  and avoid sweet beverages.   -Reviewed Health Maintenance: Yes.

## 2024-03-16 ENCOUNTER — Encounter
Admission: RE | Disposition: A | Discharge status: Home / Self Care | Payer: Self-pay | Source: Home / Self Care | Attending: Gastroenterology

## 2024-03-16 ENCOUNTER — Encounter: Payer: Self-pay | Admitting: Gastroenterology

## 2024-03-16 ENCOUNTER — Ambulatory Visit: Payer: Self-pay | Admitting: Family Medicine

## 2024-03-16 ENCOUNTER — Ambulatory Visit
Admission: RE | Admit: 2024-03-16 | Discharge: 2024-03-16 | Disposition: A | Discharge status: Home / Self Care | Attending: Gastroenterology | Admitting: Gastroenterology

## 2024-03-16 ENCOUNTER — Ambulatory Visit

## 2024-03-16 DIAGNOSIS — D123 Benign neoplasm of transverse colon: Secondary | ICD-10-CM | POA: Diagnosis not present

## 2024-03-16 DIAGNOSIS — D124 Benign neoplasm of descending colon: Secondary | ICD-10-CM | POA: Diagnosis not present

## 2024-03-16 DIAGNOSIS — J449 Chronic obstructive pulmonary disease, unspecified: Secondary | ICD-10-CM | POA: Diagnosis not present

## 2024-03-16 DIAGNOSIS — K5909 Other constipation: Secondary | ICD-10-CM | POA: Insufficient documentation

## 2024-03-16 DIAGNOSIS — K229 Disease of esophagus, unspecified: Secondary | ICD-10-CM | POA: Insufficient documentation

## 2024-03-16 DIAGNOSIS — Z860101 Personal history of adenomatous and serrated colon polyps: Secondary | ICD-10-CM | POA: Diagnosis not present

## 2024-03-16 DIAGNOSIS — K649 Unspecified hemorrhoids: Secondary | ICD-10-CM | POA: Diagnosis not present

## 2024-03-16 DIAGNOSIS — K3189 Other diseases of stomach and duodenum: Secondary | ICD-10-CM | POA: Insufficient documentation

## 2024-03-16 DIAGNOSIS — K319 Disease of stomach and duodenum, unspecified: Secondary | ICD-10-CM | POA: Diagnosis not present

## 2024-03-16 DIAGNOSIS — K640 First degree hemorrhoids: Secondary | ICD-10-CM | POA: Insufficient documentation

## 2024-03-16 DIAGNOSIS — K31A11 Gastric intestinal metaplasia without dysplasia, involving the antrum: Secondary | ICD-10-CM | POA: Diagnosis not present

## 2024-03-16 DIAGNOSIS — Z1211 Encounter for screening for malignant neoplasm of colon: Secondary | ICD-10-CM | POA: Insufficient documentation

## 2024-03-16 DIAGNOSIS — K449 Diaphragmatic hernia without obstruction or gangrene: Secondary | ICD-10-CM | POA: Diagnosis not present

## 2024-03-16 DIAGNOSIS — K219 Gastro-esophageal reflux disease without esophagitis: Secondary | ICD-10-CM | POA: Insufficient documentation

## 2024-03-16 DIAGNOSIS — D128 Benign neoplasm of rectum: Secondary | ICD-10-CM | POA: Diagnosis not present

## 2024-03-16 DIAGNOSIS — Z800 Family history of malignant neoplasm of digestive organs: Secondary | ICD-10-CM | POA: Diagnosis not present

## 2024-03-16 DIAGNOSIS — K635 Polyp of colon: Secondary | ICD-10-CM | POA: Diagnosis not present

## 2024-03-16 DIAGNOSIS — K5730 Diverticulosis of large intestine without perforation or abscess without bleeding: Secondary | ICD-10-CM | POA: Insufficient documentation

## 2024-03-16 DIAGNOSIS — K2289 Other specified disease of esophagus: Secondary | ICD-10-CM | POA: Diagnosis not present

## 2024-03-16 HISTORY — PX: ESOPHAGOGASTRODUODENOSCOPY: SHX5428

## 2024-03-16 HISTORY — PX: COLONOSCOPY: SHX5424

## 2024-03-16 HISTORY — PX: POLYPECTOMY: SHX149

## 2024-03-16 LAB — LIPID PANEL
Chol/HDL Ratio: 2.9 ratio (ref 0.0–4.4)
Cholesterol, Total: 239 mg/dL — ABNORMAL HIGH (ref 100–199)
HDL: 82 mg/dL (ref >39)
LDL Chol Calc (NIH): 147 mg/dL — ABNORMAL HIGH (ref 0–99)
Triglycerides: 62 mg/dL (ref 0–149)
VLDL Cholesterol Cal: 10 mg/dL (ref 5–40)

## 2024-03-16 LAB — COMPREHENSIVE METABOLIC PANEL WITH GFR
ALT: 13 IU/L (ref 0–32)
AST: 19 IU/L (ref 0–40)
Albumin: 4.7 g/dL (ref 3.9–4.9)
Alkaline Phosphatase: 75 IU/L (ref 44–121)
BUN/Creatinine Ratio: 13 (ref 12–28)
BUN: 9 mg/dL (ref 8–27)
Bilirubin Total: 0.9 mg/dL (ref 0.0–1.2)
CO2: 24 mmol/L (ref 20–29)
Calcium: 9.8 mg/dL (ref 8.7–10.3)
Chloride: 100 mmol/L (ref 96–106)
Creatinine, Ser: 0.71 mg/dL (ref 0.57–1.00)
Globulin, Total: 2.5 g/dL (ref 1.5–4.5)
Glucose: 85 mg/dL (ref 70–99)
Potassium: 4.4 mmol/L (ref 3.5–5.2)
Sodium: 139 mmol/L (ref 134–144)
Total Protein: 7.2 g/dL (ref 6.0–8.5)
eGFR: 93 mL/min/1.73 (ref >59)

## 2024-03-16 LAB — KOH PREP: Special Requests: NORMAL

## 2024-03-16 LAB — VITAMIN D 25 HYDROXY (VIT D DEFICIENCY, FRACTURES): Vit D, 25-Hydroxy: 37.5 ng/mL (ref 30.0–100.0)

## 2024-03-16 SURGERY — COLONOSCOPY
Anesthesia: General

## 2024-03-16 MED ORDER — LIDOCAINE HCL (CARDIAC) PF 100 MG/5ML IV SOSY
PREFILLED_SYRINGE | INTRAVENOUS | Status: DC | PRN
  Administered 2024-03-16: 100 mg via INTRAVENOUS

## 2024-03-16 MED ORDER — LIDOCAINE HCL (PF) 2 % IJ SOLN
INTRAMUSCULAR | Status: AC
  Filled 2024-03-16: qty 5

## 2024-03-16 MED ORDER — PROPOFOL 1000 MG/100ML IV EMUL
INTRAVENOUS | Status: AC
  Filled 2024-03-16: qty 100

## 2024-03-16 MED ORDER — SODIUM CHLORIDE 0.9 % IV SOLN
INTRAVENOUS | Status: DC
  Administered 2024-03-16: 20 mL/h via INTRAVENOUS

## 2024-03-16 MED ORDER — PROPOFOL 10 MG/ML IV BOLUS
INTRAVENOUS | Status: DC | PRN
  Administered 2024-03-16 (×2): 50 mg via INTRAVENOUS
  Administered 2024-03-16: 150 mg via INTRAVENOUS

## 2024-03-16 MED ORDER — PROPOFOL 500 MG/50ML IV EMUL
INTRAVENOUS | Status: DC | PRN
  Administered 2024-03-16: 150 ug/kg/min via INTRAVENOUS

## 2024-03-16 NOTE — Transfer of Care (Signed)
 Immediate Anesthesia Transfer of Care Note  Patient: Wendy Ashley  Procedure(s) Performed: COLONOSCOPY EGD (ESOPHAGOGASTRODUODENOSCOPY) POLYPECTOMY, INTESTINE  Patient Location: Endoscopy Unit  Anesthesia Type:MAC  Level of Consciousness: awake and alert   Airway & Oxygen Therapy: Patient Spontanous Breathing and Patient connected to nasal cannula oxygen  Post-op Assessment: Report given to RN and Post -op Vital signs reviewed and stable  Post vital signs: Reviewed and stable  Last Vitals:  Vitals Value Taken Time  BP 103/64 03/16/24 09:55  Temp    Pulse 68 03/16/24 09:56  Resp 16 03/16/24 09:56  SpO2 100 % 03/16/24 09:56  Vitals shown include unfiled device data.  Last Pain:  Vitals:   03/16/24 0955  TempSrc:   PainSc: 0-No pain         Complications: No notable events documented.

## 2024-03-16 NOTE — Anesthesia Postprocedure Evaluation (Signed)
 Anesthesia Post Note  Patient: Wendy Ashley  Procedure(s) Performed: COLONOSCOPY EGD (ESOPHAGOGASTRODUODENOSCOPY) POLYPECTOMY, INTESTINE  Patient location during evaluation: Endoscopy Anesthesia Type: General Level of consciousness: awake and alert Pain management: pain level controlled Vital Signs Assessment: post-procedure vital signs reviewed and stable Respiratory status: spontaneous breathing, nonlabored ventilation and respiratory function stable Cardiovascular status: blood pressure returned to baseline and stable Postop Assessment: no apparent nausea or vomiting Anesthetic complications: no   No notable events documented.   Last Vitals:  Vitals:   03/16/24 1005 03/16/24 1015  BP: 134/68 (!) 145/69  Pulse: 66 68  Resp: 12 14  Temp:    SpO2: 100% 100%    Last Pain:  Vitals:   03/16/24 1015  TempSrc:   PainSc: 0-No pain                 Fairy POUR Jamir Rone

## 2024-03-16 NOTE — Op Note (Signed)
 [facility] Gastroenterology Patient Name: Wendy Ashley Procedure Date: 03/16/2024 8:56 AM MRN: [id number] Account #: [id number] Date of Birth: 10-04-1955 Admit Type: Outpatient Age: 68 Room: [facility] ENDO ROOM 1 Gender: Female Note Status: Finalized Instrument Name: Peds Colonoscope [id number] Procedure:             Colonoscopy Indications:           High risk colon cancer surveillance: Personal history                         of colonic polyps Providers:             Elspeth Ozell Jungling DO, DO Medicines:             Monitored Anesthesia Care Complications:         No immediate complications. Estimated blood loss:                         Minimal. Procedure:             Pre-Anesthesia Assessment:                        - Prior to the procedure, a History and Physical was                         performed, and patient medications and allergies were                         reviewed. The patient is competent. The risks and                         benefits of the procedure and the sedation options and                         risks were discussed with the patient. All questions                         were answered and informed consent was obtained.                         Patient identification and proposed procedure were                         verified by the physician, the nurse, the anesthetist                         and the technician in the endoscopy suite. Mental                         Status Examination: alert and oriented. Airway                         Examination: normal oropharyngeal airway and neck                         mobility. Respiratory Examination: clear to                         auscultation. CV Examination: RRR, no murmurs, no S3  or S4. Prophylactic Antibiotics: The patient does not                         require prophylactic antibiotics. Prior                         Anticoagulants: The patient has taken no anticoagulant                          or antiplatelet agents. ASA Grade Assessment: II - A                         patient with mild systemic disease. After reviewing                         the risks and benefits, the patient was deemed in                         satisfactory condition to undergo the procedure. The                         anesthesia plan was to use monitored anesthesia care                         (MAC). Immediately prior to administration of                         medications, the patient was re-assessed for adequacy                         to receive sedatives. The heart rate, respiratory                         rate, oxygen saturations, blood pressure, adequacy of                         pulmonary ventilation, and response to care were                         monitored throughout the procedure. The physical                         status of the patient was re-assessed after the                         procedure.                        After obtaining informed consent, the colonoscope was                         passed under direct vision. Throughout the procedure,                         the patient's blood pressure, pulse, and oxygen                         saturations were monitored continuously. The  Colonoscope was introduced through the anus and                         advanced to the the cecum, identified by appendiceal                         orifice and ileocecal valve. The colonoscopy was                         performed without difficulty. The patient tolerated                         the procedure well. The quality of the bowel                         preparation was evaluated using the BBPS [facility] Bowel                         Preparation Scale) with scores of: Right Colon = 3,                         Transverse Colon = 3 and Left Colon = 3 (entire mucosa                         seen well with no residual staining, small fragments                          of stool or opaque liquid). The total BBPS score                         equals 9. The ileocecal valve, appendiceal orifice,                         and rectum were photographed. Findings:      The perianal and digital rectal examinations were normal. Pertinent       negatives include normal sphincter tone.      Retroflexion in the right colon was performed.      Non-bleeding internal hemorrhoids were found during retroflexion. The       hemorrhoids were Grade I (internal hemorrhoids that do not prolapse).       Estimated blood loss: none.      Four sessile polyps were found in the rectum, descending colon and       transverse colon (2). The polyps were 3 to 8 mm in size. These polyps       were removed with a cold snare. Resection and retrieval were complete.       Estimated blood loss was minimal.      The exam was otherwise without abnormality on direct and retroflexion       views. Impression:            - Non-bleeding internal hemorrhoids.                        - Four 3 to 8 mm polyps in the rectum, in the                         descending colon and in the  transverse colon, removed                         with a cold snare. Resected and retrieved.                        - The examination was otherwise normal on direct and                         retroflexion views. Recommendation:        - Patient has a contact number available for                         emergencies. The signs and symptoms of potential                         delayed complications were discussed with the patient.                         Return to normal activities tomorrow. Written                         discharge instructions were provided to the patient.                        - Discharge patient to home.                        - Resume previous diet.                        - Continue present medications.                        - No ibuprofen, naproxen , or other non-steroidal                          anti-inflammatory drugs for 5 days after polyp removal.                        - Await pathology results.                        - Repeat colonoscopy for surveillance based on                         pathology results.                        - Return to referring physician as previously                         scheduled.                        - The findings and recommendations were discussed with                         the patient. Procedure Code(s):     --- Professional ---  54614, Colonoscopy, flexible; with removal of                         tumor(s), polyp(s), or other lesion(s) by snare                         technique Diagnosis Code(s):     --- Professional ---                        Z86.010, Personal history of colonic polyps                        D12.8, Benign neoplasm of rectum                        D12.4, Benign neoplasm of descending colon                        D12.3, Benign neoplasm of transverse colon (hepatic                         flexure or splenic flexure)                        K64.0, First degree hemorrhoids CPT copyright 2022 American Medical Association. All rights reserved. The codes documented in this report are preliminary and upon coder review may  be revised to meet current compliance requirements. Attending Participation:      I personally performed the entire procedure. Elspeth Jungling, DO Elspeth Ozell Jungling DO, DO 03/16/2024 9:56:17 AM This report has been signed electronically. Number of Addenda: 0 Note Initiated On: 03/16/2024 8:56 AM Scope Withdrawal Time: 0 hours 17 minutes 36 seconds  Total Procedure Duration: 0 hours 24 minutes 0 seconds  Estimated Blood Loss:  Estimated blood loss was minimal.      [facility]

## 2024-03-16 NOTE — Op Note (Signed)
 [facility] Gastroenterology Patient Name: Wendy Ashley Procedure Date: 03/16/2024 9:00 AM MRN: [id number] Account #: [id number] Date of Birth: Oct 02, 1955 Admit Type: Outpatient Age: 68 Room: [facility] ENDO ROOM 1 Gender: Female Note Status: Finalized Instrument Name: Upper GI Scope [id number] Procedure:             Upper GI endoscopy Indications:           GERD Providers:             Elspeth Ozell Jungling DO, DO Medicines:             Monitored Anesthesia Care Complications:         No immediate complications. Estimated blood loss:                         Minimal. Procedure:             Pre-Anesthesia Assessment:                        - Prior to the procedure, a History and Physical was                         performed, and patient medications and allergies were                         reviewed. The patient is competent. The risks and                         benefits of the procedure and the sedation options and                         risks were discussed with the patient. All questions                         were answered and informed consent was obtained.                         Patient identification and proposed procedure were                         verified by the physician, the nurse, the anesthetist                         and the technician in the endoscopy suite. Mental                         Status Examination: alert and oriented. Airway                         Examination: normal oropharyngeal airway and neck                         mobility. Respiratory Examination: clear to                         auscultation. CV Examination: RRR, no murmurs, no S3                         or S4. Prophylactic Antibiotics: The patient does not  require prophylactic antibiotics. Prior                         Anticoagulants: The patient has taken no anticoagulant                         or antiplatelet agents. ASA Grade Assessment: II - A                          patient with mild systemic disease. After reviewing                         the risks and benefits, the patient was deemed in                         satisfactory condition to undergo the procedure. The                         anesthesia plan was to use monitored anesthesia care                         (MAC). Immediately prior to administration of                         medications, the patient was re-assessed for adequacy                         to receive sedatives. The heart rate, respiratory                         rate, oxygen saturations, blood pressure, adequacy of                         pulmonary ventilation, and response to care were                         monitored throughout the procedure. The physical                         status of the patient was re-assessed after the                         procedure.                        After obtaining informed consent, the endoscope was                         passed under direct vision. Throughout the procedure,                         the patient's blood pressure, pulse, and oxygen                         saturations were monitored continuously. The Endoscope                         was introduced through the mouth, and advanced to the  third part of duodenum. The upper GI endoscopy was                         accomplished without difficulty. The patient tolerated                         the procedure well. Findings:      The duodenal bulb, first portion of the duodenum, second portion of the       duodenum and third portion of the duodenum were normal. Estimated blood       loss: none.      A 3 cm hiatal hernia was present. Estimated blood loss: none.      Two localized 1 to 2 mm erosions with no bleeding and no stigmata of       recent bleeding were found in the gastric body. Biopsies were taken with       a cold forceps for histology. Estimated blood loss was minimal.      Normal mucosa was  found in the entire examined stomach. Biopsies were       taken with a cold forceps for Helicobacter pylori testing. Estimated       blood loss was minimal.      The exam of the stomach was otherwise normal.      The Z-line was regular. Estimated blood loss: none.      Esophagogastric landmarks were identified: the gastroesophageal junction       was found at 39 cm from the incisors.      Diffuse, white plaques were found in the upper third of the esophagus.       Biopsies were taken with a cold forceps for histology. Brushings for KOH       prep were obtained in the upper third of the esophagus. Estimated blood       loss: none.      The exam of the esophagus was otherwise normal. Impression:            - Normal duodenal bulb, first portion of the duodenum,                         second portion of the duodenum and third portion of                         the duodenum.                        - 3 cm hiatal hernia.                        - Erosive gastropathy with no bleeding and no stigmata                         of recent bleeding. Biopsied.                        - Normal mucosa was found in the entire stomach.                         Biopsied.                        - Z-line regular.                        -  Esophagogastric landmarks identified.                        - Esophageal plaques were found, suspicious for                         candidiasis. Biopsied. Brushings performed. Recommendation:        - Patient has a contact number available for                         emergencies. The signs and symptoms of potential                         delayed complications were discussed with the patient.                         Return to normal activities tomorrow. Written                         discharge instructions were provided to the patient.                        - Discharge patient to home.                        - Resume previous diet.                        - Continue present  medications.                        - Await pathology results.                        - Return to GI clinic as previously scheduled.                        - The findings and recommendations were discussed with                         the patient. Procedure Code(s):     --- Professional ---                        [phone], Esophagogastroduodenoscopy, flexible,                         transoral; with biopsy, single or multiple Diagnosis Code(s):     --- Professional ---                        K44.9, Diaphragmatic hernia without obstruction or                         gangrene                        K31.89, Other diseases of stomach and duodenum                        K22.9, Disease of esophagus, unspecified CPT copyright 2022 American Medical Association. All rights reserved. The codes documented in this report are  preliminary and upon coder review may  be revised to meet current compliance requirements. Attending Participation:      I personally performed the entire procedure. Elspeth Jungling, DO Elspeth Ozell Jungling DO, DO 03/16/2024 9:22:28 AM This report has been signed electronically. Number of Addenda: 0 Note Initiated On: 03/16/2024 9:00 AM Estimated Blood Loss:  Estimated blood loss was minimal.      [facility]

## 2024-03-16 NOTE — Interval H&P Note (Signed)
 History and Physical Interval Note: Preprocedure H&P from 03/16/24  was reviewed and there was no interval change after seeing and examining the patient.  Written consent was obtained from the patient after discussion of risks, benefits, and alternatives. Patient has consented to proceed with Esophagogastroduodenoscopy and Colonoscopy with possible intervention    03/16/2024 9:08 AM  Wendy Ashley  has presented today for surgery, with the diagnosis of GERD,HX OF ADENOMATOUS  POLYP OF COLON.  The various methods of treatment have been discussed with the patient and family. After consideration of risks, benefits and other options for treatment, the patient has consented to  Procedure(s): COLONOSCOPY (N/A) EGD (ESOPHAGOGASTRODUODENOSCOPY) (N/A) as a surgical intervention.  The patient's history has been reviewed, patient examined, no change in status, stable for surgery.  I have reviewed the patient's chart and labs.  Questions were answered to the patient's satisfaction.     Elspeth Ozell Jungling
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
## 2024-03-19 LAB — SURGICAL PATHOLOGY

## 2024-06-14 DIAGNOSIS — Z0000 Encounter for general adult medical examination without abnormal findings: Secondary | ICD-10-CM
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
## 2024-06-14 NOTE — Patient Instructions (Addendum)
 Wendy Ashley,  Thank you for taking the time for your Medicare Wellness Visit. I appreciate your continued commitment to your health goals. Please review the care plan we discussed, and feel free to reach out if I can assist you further.  Please note that Annual Wellness Visits do not include a physical exam. Some assessments may be limited, especially if the visit was conducted virtually. If needed, we may recommend an in-person follow-up with your provider.  Ongoing Care Seeing your primary care provider every 3 to 6 months helps us  monitor your health and provide consistent, personalized care.   Referrals If a referral was made during today's visit and you haven't received any updates within two weeks, please contact the referred provider directly to check on the status.  [facility] [phone]  Recommended Screenings:  Health Maintenance  Topic Date Due   COVID-19 Vaccine (3 - Pfizer risk series) 03/17/2020   Flu Shot  02/10/2024   Osteoporosis screening with Bone Density Scan  07/23/2024   Breast Cancer Screening  03/09/2025   Medicare Annual Wellness Visit  06/14/2025   DTaP/Tdap/Td vaccine (3 - Td or Tdap) 11/03/2025   Colon Cancer Screening  03/16/2029   Pneumococcal Vaccine for age over 5  Completed   Hepatitis C Screening  Completed   Zoster (Shingles) Vaccine  Completed   Meningitis B Vaccine  Aged Out     Vision: Annual vision screenings are recommended for early detection of glaucoma, cataracts, and diabetic retinopathy. These exams can also reveal signs of chronic conditions such as diabetes and high blood pressure.  Dental: Annual dental screenings help detect early signs of oral cancer, gum disease, and other conditions linked to overall health, including heart disease and diabetes.  Please see the attached documents for additional preventive care recommendations.   NEXT AWV 06/20/25 @ 11:30 AM IN PERSON

## 2024-08-13 ENCOUNTER — Ambulatory Visit: Payer: Self-pay

## 2024-08-14 ENCOUNTER — Ambulatory Visit: Admitting: Internal Medicine

## 2025-03-22 ENCOUNTER — Encounter: Admitting: Family Medicine

## 2025-06-20 ENCOUNTER — Ambulatory Visit
